# Patient Record
Sex: Male | Born: 1965 | Race: White | Hispanic: No | Marital: Single | Smoking: Never smoker
Health system: Southern US, Community
[De-identification: ages and names within clinical notes are randomized; demographics above are authoritative.]

## PROBLEM LIST (undated history)

## (undated) ENCOUNTER — Inpatient Hospital Stay: Admission: EM | Payer: Self-pay | Source: Home / Self Care

## (undated) DIAGNOSIS — C801 Malignant (primary) neoplasm, unspecified: Secondary | ICD-10-CM

## (undated) DIAGNOSIS — C6292 Malignant neoplasm of left testis, unspecified whether descended or undescended: Secondary | ICD-10-CM

## (undated) DIAGNOSIS — Z9289 Personal history of other medical treatment: Secondary | ICD-10-CM

## (undated) HISTORY — DX: Malignant neoplasm of left testis, unspecified whether descended or undescended: C62.92

## (undated) HISTORY — PX: WISDOM TOOTH EXTRACTION: SHX21

## (undated) HISTORY — PX: EYE SURGERY: SHX253

## (undated) HISTORY — PX: TESTICLE REMOVAL: SHX68

## (undated) HISTORY — PX: MANDIBLE FRACTURE SURGERY: SHX706

## (undated) HISTORY — PX: DUPUYTREN CONTRACTURE RELEASE: SHX1478

## (undated) HISTORY — PX: ORIF PELVIC FRACTURE: SUR948

---

## 2009-09-30 DIAGNOSIS — C801 Malignant (primary) neoplasm, unspecified: Secondary | ICD-10-CM

## 2009-09-30 HISTORY — DX: Malignant (primary) neoplasm, unspecified: C80.1

## 2009-12-02 ENCOUNTER — Ambulatory Visit: Payer: Self-pay | Admitting: Hematology & Oncology

## 2009-12-10 LAB — CBC WITH DIFFERENTIAL (CANCER CENTER ONLY)
Eosinophils Absolute: 0.1 10*3/uL (ref 0.0–0.5)
HGB: 15.9 g/dL (ref 13.0–17.1)
LYMPH%: 28.3 % (ref 14.0–48.0)
MCH: 31.6 pg (ref 28.0–33.4)
MCHC: 33.4 g/dL (ref 32.0–35.9)
MONO#: 0.4 10*3/uL (ref 0.1–0.9)
NEUT#: 3.2 10*3/uL (ref 1.5–6.5)
Platelets: 184 10*3/uL (ref 145–400)
RBC: 5.04 10*6/uL (ref 4.20–5.70)

## 2009-12-12 LAB — COMPREHENSIVE METABOLIC PANEL
Albumin: 4.5 g/dL (ref 3.5–5.2)
Alkaline Phosphatase: 65 U/L (ref 39–117)
BUN: 17 mg/dL (ref 6–23)
CO2: 24 mEq/L (ref 19–32)
Chloride: 106 mEq/L (ref 96–112)
Glucose, Bld: 96 mg/dL (ref 70–99)
Potassium: 4.8 mEq/L (ref 3.5–5.3)
Sodium: 140 mEq/L (ref 135–145)

## 2009-12-12 LAB — BETA HCG QUANT (REF LAB): Beta hCG, Tumor Marker: <0.5 m[IU]/mL (ref <5.0)

## 2009-12-12 LAB — LACTATE DEHYDROGENASE: LDH: 114 U/L (ref 94–250)

## 2010-01-06 ENCOUNTER — Ambulatory Visit: Payer: Self-pay | Admitting: Hematology & Oncology

## 2010-01-07 LAB — CBC WITH DIFFERENTIAL (CANCER CENTER ONLY)
BASO#: 0 10*3/uL (ref 0.0–0.2)
BASO%: 0.8 % (ref 0.0–2.0)
EOS%: 1.8 % (ref 0.0–7.0)
Eosinophils Absolute: 0.1 10*3/uL (ref 0.0–0.5)
HCT: 47 % (ref 38.7–49.9)
HGB: 15.5 g/dL (ref 13.0–17.1)
LYMPH#: 1.2 10*3/uL (ref 0.9–3.3)
LYMPH%: 38.1 % (ref 14.0–48.0)
MCV: 96 fL (ref 82–98)
MONO#: 0.3 10*3/uL (ref 0.1–0.9)
NEUT%: 50 % (ref 40.0–80.0)

## 2010-01-07 LAB — COMPREHENSIVE METABOLIC PANEL
AST: 17 U/L (ref 0–37)
Albumin: 4.8 g/dL (ref 3.5–5.2)
Calcium: 9.7 mg/dL (ref 8.4–10.5)
Chloride: 103 mEq/L (ref 96–112)
Potassium: 4.5 mEq/L (ref 3.5–5.3)
Sodium: 140 mEq/L (ref 135–145)
Total Bilirubin: 0.9 mg/dL (ref 0.3–1.2)
Total Protein: 7.6 g/dL (ref 6.0–8.3)

## 2010-02-16 ENCOUNTER — Ambulatory Visit: Payer: Self-pay | Admitting: Hematology & Oncology

## 2010-02-18 LAB — CBC WITH DIFFERENTIAL (CANCER CENTER ONLY)
BASO#: 0 10*3/uL (ref 0.0–0.2)
BASO%: 0.5 % (ref 0.0–2.0)
EOS%: 1.8 % (ref 0.0–7.0)
Eosinophils Absolute: 0.1 10*3/uL (ref 0.0–0.5)
HCT: 43.1 % (ref 38.7–49.9)
HGB: 14.5 g/dL (ref 13.0–17.1)
MONO%: 9.8 % (ref 0.0–13.0)
NEUT%: 41.1 % (ref 40.0–80.0)
Platelets: 149 10*3/uL (ref 145–400)
RBC: 4.3 10*6/uL (ref 4.20–5.70)

## 2010-02-20 LAB — COMPREHENSIVE METABOLIC PANEL
ALT: 17 U/L (ref 0–53)
AST: 14 U/L (ref 0–37)
Albumin: 4.1 g/dL (ref 3.5–5.2)
Alkaline Phosphatase: 52 U/L (ref 39–117)
Potassium: 4.3 mEq/L (ref 3.5–5.3)
Total Bilirubin: 0.6 mg/dL (ref 0.3–1.2)

## 2010-02-20 LAB — LACTATE DEHYDROGENASE: LDH: 107 U/L (ref 94–250)

## 2010-03-18 ENCOUNTER — Ambulatory Visit (HOSPITAL_BASED_OUTPATIENT_CLINIC_OR_DEPARTMENT_OTHER): Admission: RE | Admit: 2010-03-18 | Discharge: 2010-03-18 | Payer: Self-pay | Admitting: Hematology & Oncology

## 2010-03-18 ENCOUNTER — Ambulatory Visit: Payer: Self-pay | Admitting: Radiology

## 2010-04-01 ENCOUNTER — Ambulatory Visit: Payer: Self-pay | Admitting: Hematology & Oncology

## 2010-04-01 LAB — CBC WITH DIFFERENTIAL (CANCER CENTER ONLY)
EOS%: 2.3 % (ref 0.0–7.0)
HCT: 45.2 % (ref 38.7–49.9)
HGB: 15.3 g/dL (ref 13.0–17.1)
LYMPH#: 0.9 10*3/uL (ref 0.9–3.3)
MCH: 33.7 pg — ABNORMAL HIGH (ref 28.0–33.4)
NEUT#: 2.8 10*3/uL (ref 1.5–6.5)
NEUT%: 69 % (ref 40.0–80.0)
Platelets: 127 10*3/uL — ABNORMAL LOW (ref 145–400)
RBC: 4.54 10*6/uL (ref 4.20–5.70)
WBC: 4 10*3/uL (ref 4.0–10.0)

## 2010-04-03 LAB — COMPREHENSIVE METABOLIC PANEL
ALT: 16 U/L (ref 0–53)
Alkaline Phosphatase: 53 U/L (ref 39–117)
BUN: 20 mg/dL (ref 6–23)
CO2: 24 mEq/L (ref 19–32)
Chloride: 107 mEq/L (ref 96–112)
Glucose, Bld: 122 mg/dL — ABNORMAL HIGH (ref 70–99)
Potassium: 4.3 mEq/L (ref 3.5–5.3)
Total Bilirubin: 0.8 mg/dL (ref 0.3–1.2)

## 2010-04-03 LAB — AFP TUMOR MARKER: AFP-Tumor Marker: 6.4 ng/mL (ref 0.0–8.0)

## 2010-04-03 LAB — BETA HCG QUANT (REF LAB): Beta hCG, Tumor Marker: <0.5 m[IU]/mL (ref <5.0)

## 2010-07-13 ENCOUNTER — Ambulatory Visit: Payer: Self-pay | Admitting: Hematology & Oncology

## 2010-11-21 ENCOUNTER — Encounter: Payer: Self-pay | Admitting: Hematology & Oncology

## 2012-09-26 ENCOUNTER — Telehealth: Payer: Self-pay | Admitting: Hematology & Oncology

## 2012-09-26 NOTE — Telephone Encounter (Signed)
Pt called made 10-11-12 appointment MD aware

## 2012-10-11 ENCOUNTER — Other Ambulatory Visit (HOSPITAL_BASED_OUTPATIENT_CLINIC_OR_DEPARTMENT_OTHER): Payer: Self-pay | Admitting: Lab

## 2012-10-11 ENCOUNTER — Ambulatory Visit (HOSPITAL_BASED_OUTPATIENT_CLINIC_OR_DEPARTMENT_OTHER): Payer: Self-pay | Admitting: Hematology & Oncology

## 2012-10-11 VITALS — BP 125/83 | HR 69 | Temp 97.4°F | Resp 18 | Ht 77.0 in | Wt 235.0 lb

## 2012-10-11 DIAGNOSIS — E785 Hyperlipidemia, unspecified: Secondary | ICD-10-CM

## 2012-10-11 DIAGNOSIS — C6290 Malignant neoplasm of unspecified testis, unspecified whether descended or undescended: Secondary | ICD-10-CM

## 2012-10-11 DIAGNOSIS — E291 Testicular hypofunction: Secondary | ICD-10-CM

## 2012-10-11 LAB — CBC WITH DIFFERENTIAL (CANCER CENTER ONLY)
EOS%: 1.4 % (ref 0.0–7.0)
Eosinophils Absolute: 0.1 10*3/uL (ref 0.0–0.5)
HCT: 47.7 % (ref 38.7–49.9)
LYMPH#: 1.8 10*3/uL (ref 0.9–3.3)
MCHC: 33.3 g/dL (ref 32.0–35.9)
MONO#: 0.8 10*3/uL (ref 0.1–0.9)
NEUT#: 3.2 10*3/uL (ref 1.5–6.5)
Platelets: 150 10*3/uL (ref 145–400)
RBC: 5 10*6/uL (ref 4.20–5.70)
RDW: 13.1 % (ref 11.1–15.7)
WBC: 5.8 10*3/uL (ref 4.0–10.0)

## 2012-10-11 NOTE — Progress Notes (Signed)
This office note has been dictated.

## 2012-10-12 NOTE — Progress Notes (Signed)
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  CURRENT THERAPY:  Observation.  INTERIM HISTORY:  Mr. Jesse George comes in for a long awaited followup. Since we last saw him back in June of 2001, he has been incarcerated. It was some type of federal fraud.  He spent 2 years there.  He really looks incredibly good.  He obviously had a very good doctor while he was there.  He got scans, he got checkups.  He said that they were going to send [place] all of the data but I have not received anything.  He has had no physical complaints.  He has had no abdominal pain.  He has had no cough or shortness of breath.  He has had no leg swelling. He has had no problems going to the bathroom.  PHYSICAL EXAMINATION:  General:  This is a well-developed, well- nourished white gentleman in no obvious distress.  Vital signs:  97.4, pulse 69, respiratory rate 18, blood pressure 125/83.  Weight is 235. Head and neck:  Shows a normocephalic, atraumatic skull.  There are no ocular or oral lesions.  There are no palpable cervical or supraclavicular lymph nodes.  Lungs:  Clear bilaterally.  Cardiac: Regular rate and rhythm with a normal S1 and S2.  There are no murmurs, rubs or bruits.  Abdomen:  Soft with good bowel sounds.  There is no palpable abdominal mass.  There is no palpable hepatosplenomegaly.  He has a well-healed left inguinal orchiectomy scar.  Back:  No tenderness over the spine, ribs or hips.  Extremities:  Show no clubbing, cyanosis or edema.  LABORATORY STUDIES:  Show white cell count 5.8, hemoglobin 15.9, hematocrit 47.7, platelet count is 150.  IMPRESSION:  Mr. [name] is a 46 year old gentleman with a history of stage IB (T2 N0 M0) seminoma of the left testicle.  He underwent orchiectomy.  He got 2 cycles of adjuvant carboplatin.  This was completed in March of 2011.  I still feel that we need to follow him along every 6 months.  As such, I will set him up with a CT scan of the  chest, abdomen and pelvis in May.  We are following his tumor markers.  I am just glad to see that he is doing so well.    ______________________________ Josph Macho, M.D. PRE/MEDQ  D:  10/11/2012  T:  10/12/2012  Job:  [id number]

## 2012-10-14 LAB — COMPREHENSIVE METABOLIC PANEL
ALT: 25 U/L (ref 0–53)
AST: 17 U/L (ref 0–37)
Albumin: 4.3 g/dL (ref 3.5–5.2)
BUN: 19 mg/dL (ref 6–23)
CO2: 22 mEq/L (ref 19–32)
Chloride: 103 mEq/L (ref 96–112)
Potassium: 4.6 mEq/L (ref 3.5–5.3)
Sodium: 142 mEq/L (ref 135–145)
Total Bilirubin: 0.7 mg/dL (ref 0.3–1.2)

## 2012-10-14 LAB — LACTATE DEHYDROGENASE: LDH: 102 U/L (ref 94–250)

## 2012-10-18 ENCOUNTER — Other Ambulatory Visit (HOSPITAL_BASED_OUTPATIENT_CLINIC_OR_DEPARTMENT_OTHER): Payer: Self-pay | Admitting: Lab

## 2012-10-18 ENCOUNTER — Other Ambulatory Visit: Payer: Self-pay | Admitting: *Deleted

## 2012-10-18 DIAGNOSIS — C6290 Malignant neoplasm of unspecified testis, unspecified whether descended or undescended: Secondary | ICD-10-CM

## 2012-10-18 DIAGNOSIS — E291 Testicular hypofunction: Secondary | ICD-10-CM

## 2012-10-18 LAB — TESTOSTERONE: Testosterone: 537.53 ng/dL (ref 300–890)

## 2012-10-18 NOTE — Progress Notes (Signed)
Pt called to obtain the results of his testosterone level from his visit with Dr Myna Hidalgo on 10/11/12.  It was not drawn, therefore he will come in today to have it done.

## 2012-10-19 ENCOUNTER — Telehealth: Payer: Self-pay | Admitting: Oncology

## 2012-10-19 NOTE — Telephone Encounter (Addendum)
Message copied by Lacie Draft on Fri Oct 19, 2012  3:01 PM ------      Message from: Josph Macho      Created: Fri Oct 19, 2012  7:19 AM       Call - testosterone is great!!!  Pete  Left message on machine that lab work great. Teola Bradley, Elynn Patteson [employer]

## 2013-03-11 ENCOUNTER — Ambulatory Visit (HOSPITAL_BASED_OUTPATIENT_CLINIC_OR_DEPARTMENT_OTHER)
Admission: RE | Admit: 2013-03-11 | Discharge: 2013-03-11 | Disposition: A | Discharge status: Home / Self Care | Payer: BC Managed Care – PPO | Source: Ambulatory Visit | Attending: Hematology & Oncology | Admitting: Hematology & Oncology

## 2013-03-11 ENCOUNTER — Encounter (HOSPITAL_BASED_OUTPATIENT_CLINIC_OR_DEPARTMENT_OTHER): Payer: Self-pay

## 2013-03-11 ENCOUNTER — Other Ambulatory Visit: Payer: Self-pay | Admitting: Lab

## 2013-03-11 DIAGNOSIS — Z09 Encounter for follow-up examination after completed treatment for conditions other than malignant neoplasm: Secondary | ICD-10-CM | POA: Insufficient documentation

## 2013-03-11 DIAGNOSIS — C6290 Malignant neoplasm of unspecified testis, unspecified whether descended or undescended: Secondary | ICD-10-CM

## 2013-03-11 DIAGNOSIS — K7689 Other specified diseases of liver: Secondary | ICD-10-CM | POA: Insufficient documentation

## 2013-03-11 DIAGNOSIS — E785 Hyperlipidemia, unspecified: Secondary | ICD-10-CM

## 2013-03-11 DIAGNOSIS — Z8547 Personal history of malignant neoplasm of testis: Secondary | ICD-10-CM | POA: Insufficient documentation

## 2013-03-11 DIAGNOSIS — E291 Testicular hypofunction: Secondary | ICD-10-CM

## 2013-03-11 DIAGNOSIS — K8020 Calculus of gallbladder without cholecystitis without obstruction: Secondary | ICD-10-CM | POA: Insufficient documentation

## 2013-03-11 HISTORY — DX: Malignant (primary) neoplasm, unspecified: C80.1

## 2013-03-11 MED ORDER — IOHEXOL 300 MG/ML  SOLN
100.0000 mL | Freq: Once | INTRAMUSCULAR | Status: AC | PRN
  Administered 2013-03-11: 100 mL via INTRAVENOUS

## 2013-03-12 ENCOUNTER — Telehealth: Payer: Self-pay | Admitting: Hematology & Oncology

## 2013-03-12 NOTE — Telephone Encounter (Addendum)
Message copied by Cathi Roan on Tue Mar 12, 2013  3:31 PM ------      Message from: Josph Macho      Created: Tue Mar 12, 2013  7:25 AM       Please call and tell him that there is no evidence of recurrent testicular cancer on his CT scan. Thanks. Pete ------  5-13-   3:25   Called patient at home and  left message regarding above MD message, if any questions to call this office.  Lupita Raider LPN

## 2013-03-18 ENCOUNTER — Ambulatory Visit (HOSPITAL_BASED_OUTPATIENT_CLINIC_OR_DEPARTMENT_OTHER): Payer: BC Managed Care – PPO | Admitting: Hematology & Oncology

## 2013-03-18 VITALS — BP 122/83 | HR 91 | Temp 97.8°F | Resp 18 | Ht 75.0 in | Wt 237.0 lb

## 2013-03-18 DIAGNOSIS — C6292 Malignant neoplasm of left testis, unspecified whether descended or undescended: Secondary | ICD-10-CM

## 2013-03-18 DIAGNOSIS — Z8547 Personal history of malignant neoplasm of testis: Secondary | ICD-10-CM

## 2013-03-18 NOTE — Progress Notes (Signed)
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  CURRENT THERAPY:  Observation.  INTERIM HISTORY:  Jesse George comes in for followup.  We last saw him back in December.  He is doing well.  He is still being monitored by the federal authorities.  He got out of prison about I think 6 months ago or so.  He, hopefully, will be going on some vacations this summer.  When he travels, he has to let the authorities know where he is going.  He is feeling well.  He has had no abdominal pain.  He has had no leg pain.  He has had no cough or shortness of breath.  There has been no change in bowel or bladder habits.  He has had no headache.  We did go ahead and repeat his scans.  They were done back on May 12. The scans did not show any evidence of recurrent seminoma.  His last tumor markers were done back a year ago.  His alpha-fetoprotein and beta HCG were both normal.  PHYSICAL EXAMINATION:  General:  This is a well-developed, well- nourished white gentleman in no obvious distress.  Vital signs:  Show a temperature of 97.8, pulse 91, respiratory rate 18, blood pressure 1220/83.  Weight is 237.  Head and neck:  Shows a normocephalic, atraumatic skull.  There are no ocular or oral lesions.  There are no palpable cervical or supraclavicular lymph nodes.  Lungs:  Clear to percussion and auscultation bilaterally.  Cardiac:  Regular rate and rhythm with normal S1, S2.  There are no murmurs, rubs or bruits. Abdomen:  Soft with good bowel sounds.  There is no palpable abdominal mass.  There is no fluid wave.  There is no palpable hepatosplenomegaly. His left inguinal orchiectomy scar is well-healed.  Extremities:  Show no clubbing, cyanosis or edema.  Neurological:  Shows no focal neurological deficits.  Skin:  No rash, ecchymosis or petechia.  LABORATORY:  Not done this visit.  IMPRESSION:  Jesse George is a 47 year old white gentleman with a history of stage IB seminoma of the left  testicle.  We did go ahead and give him 2 cycles of adjuvant carboplatin.  He completed this in March of 2011.  We will continue to follow him along every 6 months or so.  We will go ahead and plan for followup scans when we see him back in the fall.  I do not see need for any additional lab work now.  We will get some lab work on him when we see him back in the fall.    ______________________________ Josph Macho, M.D. PRE/MEDQ  D:  03/18/2013  T:  03/18/2013  Job:  [id number]

## 2013-03-18 NOTE — Progress Notes (Signed)
This office note has been dictated.

## 2013-03-29 ENCOUNTER — Other Ambulatory Visit: Payer: Self-pay | Admitting: Hematology & Oncology

## 2013-03-29 DIAGNOSIS — N529 Male erectile dysfunction, unspecified: Secondary | ICD-10-CM

## 2013-03-29 MED ORDER — SILDENAFIL CITRATE 50 MG PO TABS: 50.0000 mg | ORAL_TABLET | Freq: Every day | ORAL | Status: DC | PRN

## 2013-08-26 ENCOUNTER — Other Ambulatory Visit: Payer: BC Managed Care – PPO | Admitting: Lab

## 2013-08-26 ENCOUNTER — Ambulatory Visit (HOSPITAL_BASED_OUTPATIENT_CLINIC_OR_DEPARTMENT_OTHER): Payer: BC Managed Care – PPO

## 2013-09-02 ENCOUNTER — Ambulatory Visit: Payer: BC Managed Care – PPO | Admitting: Hematology & Oncology

## 2013-11-04 ENCOUNTER — Ambulatory Visit (HOSPITAL_BASED_OUTPATIENT_CLINIC_OR_DEPARTMENT_OTHER)
Admission: RE | Admit: 2013-11-04 | Discharge: 2013-11-04 | Disposition: A | Discharge status: Home / Self Care | Payer: BC Managed Care – PPO | Source: Ambulatory Visit | Attending: Hematology & Oncology | Admitting: Hematology & Oncology

## 2013-11-04 ENCOUNTER — Encounter (HOSPITAL_BASED_OUTPATIENT_CLINIC_OR_DEPARTMENT_OTHER): Payer: Self-pay

## 2013-11-04 ENCOUNTER — Other Ambulatory Visit (HOSPITAL_BASED_OUTPATIENT_CLINIC_OR_DEPARTMENT_OTHER): Payer: BC Managed Care – PPO | Admitting: Lab

## 2013-11-04 DIAGNOSIS — K7689 Other specified diseases of liver: Secondary | ICD-10-CM | POA: Insufficient documentation

## 2013-11-04 DIAGNOSIS — Z8547 Personal history of malignant neoplasm of testis: Secondary | ICD-10-CM

## 2013-11-04 DIAGNOSIS — C6292 Malignant neoplasm of left testis, unspecified whether descended or undescended: Secondary | ICD-10-CM

## 2013-11-04 DIAGNOSIS — Z859 Personal history of malignant neoplasm, unspecified: Secondary | ICD-10-CM | POA: Insufficient documentation

## 2013-11-04 DIAGNOSIS — K8020 Calculus of gallbladder without cholecystitis without obstruction: Secondary | ICD-10-CM | POA: Insufficient documentation

## 2013-11-04 LAB — CMP (CANCER CENTER ONLY)
ALBUMIN: 3.7 g/dL (ref 3.3–5.5)
ALK PHOS: 51 U/L (ref 26–84)
ALT: 30 U/L (ref 10–47)
AST: 22 U/L (ref 11–38)
BILIRUBIN TOTAL: 1 mg/dL (ref 0.20–1.60)
BUN, Bld: 16 mg/dL (ref 7–22)
CO2: 33 mEq/L (ref 18–33)
Calcium: 8.9 mg/dL (ref 8.0–10.3)
Chloride: 104 mEq/L (ref 98–108)
Creat: 1 mg/dl (ref 0.6–1.2)
Glucose, Bld: 99 mg/dL (ref 73–118)
POTASSIUM: 4.4 meq/L (ref 3.3–4.7)
Sodium: 144 mEq/L (ref 128–145)
TOTAL PROTEIN: 7.4 g/dL (ref 6.4–8.1)

## 2013-11-04 LAB — CBC WITH DIFFERENTIAL (CANCER CENTER ONLY)
BASO#: 0 10*3/uL (ref 0.0–0.2)
BASO%: 0.4 % (ref 0.0–2.0)
EOS ABS: 0.1 10*3/uL (ref 0.0–0.5)
EOS%: 1.7 % (ref 0.0–7.0)
HEMATOCRIT: 50.3 % — AB (ref 38.7–49.9)
HEMOGLOBIN: 16.9 g/dL (ref 13.0–17.1)
LYMPH#: 1.7 10*3/uL (ref 0.9–3.3)
LYMPH%: 32.4 % (ref 14.0–48.0)
MCH: 32.1 pg (ref 28.0–33.4)
MCHC: 33.6 g/dL (ref 32.0–35.9)
MCV: 95 fL (ref 82–98)
MONO#: 0.5 10*3/uL (ref 0.1–0.9)
MONO%: 10.4 % (ref 0.0–13.0)
NEUT#: 2.9 10*3/uL (ref 1.5–6.5)
NEUT%: 55.1 % (ref 40.0–80.0)
Platelets: 142 10*3/uL — ABNORMAL LOW (ref 145–400)
RBC: 5.27 10*6/uL (ref 4.20–5.70)
RDW: 13.1 % (ref 11.1–15.7)
WBC: 5.2 10*3/uL (ref 4.0–10.0)

## 2013-11-04 MED ORDER — IOHEXOL 300 MG/ML  SOLN
100.0000 mL | Freq: Once | INTRAMUSCULAR | Status: AC | PRN
  Administered 2013-11-04: 100 mL via INTRAVENOUS

## 2013-11-05 ENCOUNTER — Telehealth: Payer: Self-pay | Admitting: *Deleted

## 2013-11-05 NOTE — Telephone Encounter (Signed)
Called patient to let him know that  no cancer has come back on his scans. Patient  Appreciated this news.

## 2013-11-05 NOTE — Telephone Encounter (Signed)
Message copied by Rico Ala on Tue Nov 05, 2013 11:28 AM ------      Message from: Burney Gauze R      Created: Mon Nov 04, 2013  6:22 PM       Please call and tell her that there is no cancer has come back on his scans. thanks. Pete E. ------

## 2013-11-07 LAB — BETA HCG QUANT (REF LAB): Beta hCG, Tumor Marker: <2 m[IU]/mL (ref <5.0)

## 2013-11-07 LAB — LACTATE DEHYDROGENASE: LDH: 120 U/L (ref 94–250)

## 2013-11-07 LAB — AFP TUMOR MARKER: AFP-Tumor Marker: 6 ng/mL (ref 0.0–8.0)

## 2013-11-11 ENCOUNTER — Ambulatory Visit (HOSPITAL_BASED_OUTPATIENT_CLINIC_OR_DEPARTMENT_OTHER): Payer: BC Managed Care – PPO | Admitting: Hematology & Oncology

## 2013-11-11 VITALS — BP 107/73 | HR 74 | Temp 98.1°F | Resp 18 | Ht 74.0 in | Wt 224.0 lb

## 2013-11-11 DIAGNOSIS — Z8547 Personal history of malignant neoplasm of testis: Secondary | ICD-10-CM

## 2013-11-11 DIAGNOSIS — C6292 Malignant neoplasm of left testis, unspecified whether descended or undescended: Secondary | ICD-10-CM

## 2013-11-11 NOTE — Progress Notes (Signed)
This office note has been dictated.

## 2013-11-12 NOTE — Progress Notes (Signed)
DIAGNOSIS:  Stage IB (T2, N0, M0) seminoma of the left testicle.  CURRENT THERAPY:  Observation.  INTERIM HISTORY:  Jesse George comes in for followup.  He was every 6 months or so.  He is doing quite well.  He still is in a relationship that he is happy about.  He is not having as much problem with the __________.  He was incarcerated for I think couple years.  He has been out of prison now probably for about a year now.  We are following his testicular cancer with CT scans.  He had a set of scans done back on January 5th of this year.  The CT scans did not show any evidence of recurrent disease.  His tumor markers also were done on January 5th.  His alpha fetoprotein was 6.  Beta hCG was less than 2.  LDH was 120.  He has had no problems with cough or shortness of breath.  He has had no nausea or vomiting.  There has been no leg swelling.  He has had no rashes.  He has had no change in bowel or bladder habits.  He did have a sebaceous cyst removed from his back by dermatologist I think about a month or so ago.  He has noticed an inflammatory lesion on the right side of the scrotal sac.  This may need to be opened up surgically by his urologist.  PHYSICAL EXAMINATION:  General:  This is a well-developed, well- nourished white gentleman, in no obvious distress.  Vital Signs: Temperature of 98.1, pulse 74, respiratory rate 18, blood pressure 107/73, weight is 224 pounds.  Head and Neck:  Normocephalic, atraumatic skull.  There are no ocular or oral lesions.  He has no palpable cervical or supraclavicular lymph nodes.  Lungs:  Clear bilaterally. Cardiac:  Regular rate and rhythm with a normal S1, S2.  There are no murmurs, rubs, or bruits.  Abdomen:  Soft.  He has good bowel sounds. There is no fluid wave.  There is no palpable abdominal mass.  There is no palpable hepatosplenomegaly.  He has well-healed left inguinal orchiectomy scar.  Scrotal exam does show a forming carbuncle in  the inferior lateral aspect of the right scrotal sac.  This is tender to touch.  This is quite firm.  Extremity shows no clubbing, cyanosis, or edema.  He has good range motion of his joints.  Neurological:  No focal neurological deficits.  Skin:  No rashes, ecchymosis, or petechia. Again, he has the carbuncle on the right scrotal sac.  LABORATORY DATA:  His laboratory says white cell count is 5.2, hemoglobin 17, hematocrit 50, platelet count 142.  Electrolytes are normal in value.  IMPRESSION:  Jesse George is a 48 year old gentleman with history of stage IB seminoma of the left testicle.  He underwent orchidectomy.  We did give him 2 cycles of adjuvant carboplatin.  He completed this in March 2011.  For now, I do not see any evidence of recurrent disease.  I told him to put some moist warm compresses on this carbuncle.  This continues to expand, and does not drain itself, then he likely will need to have a surgical incision for this.  He does understand this.  He does have an urologist that he can see.  We will go ahead and plan to get him back to see [place] in another 6 months.  Once we get him through 5 years, then we will go to yearly evaluation without having to  do scans.    ______________________________ Volanda Napoleon, M.D. PRE/MEDQ  D:  11/11/2013  T:  11/12/2013  Job:  [id number]

## 2013-11-15 ENCOUNTER — Telehealth: Payer: Self-pay | Admitting: Hematology & Oncology

## 2013-11-15 ENCOUNTER — Telehealth: Payer: Self-pay | Admitting: Nurse Practitioner

## 2013-11-15 NOTE — Telephone Encounter (Addendum)
Message copied by Jimmy Footman on Fri Nov 15, 2013  4:15 PM ------      Message from: Burney Gauze R      Created: Thu Nov 07, 2013  6:35 PM       Call - labs are ok!!  Peet  Pt verbalized understanding and appreciation. ------

## 2013-11-15 NOTE — Telephone Encounter (Signed)
Pt in ofc today to drop off [place] Application. Sending application via inter ofc mail today to [place]

## 2013-12-13 ENCOUNTER — Telehealth: Payer: Self-pay | Admitting: Hematology & Oncology

## 2013-12-13 NOTE — Telephone Encounter (Signed)
Pt's mother called wanting to speak to the office manager. She would not say why and that no one would help her. I transferred her to managers voice mail

## 2014-03-07 ENCOUNTER — Encounter: Payer: Self-pay | Admitting: Nurse Practitioner

## 2014-03-07 NOTE — Progress Notes (Signed)
Certificate of disability for property tax exclusion was faxed to [place] at [phone]. Confirmation received.

## 2014-05-05 ENCOUNTER — Other Ambulatory Visit (HOSPITAL_BASED_OUTPATIENT_CLINIC_OR_DEPARTMENT_OTHER): Payer: BC Managed Care – PPO | Admitting: Lab

## 2014-05-05 ENCOUNTER — Ambulatory Visit (HOSPITAL_BASED_OUTPATIENT_CLINIC_OR_DEPARTMENT_OTHER)
Admission: RE | Admit: 2014-05-05 | Discharge: 2014-05-05 | Disposition: A | Discharge status: Home / Self Care | Payer: BC Managed Care – PPO | Source: Ambulatory Visit | Attending: Hematology & Oncology | Admitting: Hematology & Oncology

## 2014-05-05 ENCOUNTER — Encounter (HOSPITAL_BASED_OUTPATIENT_CLINIC_OR_DEPARTMENT_OTHER): Payer: Self-pay

## 2014-05-05 DIAGNOSIS — Z8547 Personal history of malignant neoplasm of testis: Secondary | ICD-10-CM | POA: Insufficient documentation

## 2014-05-05 DIAGNOSIS — C6292 Malignant neoplasm of left testis, unspecified whether descended or undescended: Secondary | ICD-10-CM

## 2014-05-05 DIAGNOSIS — Z9079 Acquired absence of other genital organ(s): Secondary | ICD-10-CM | POA: Insufficient documentation

## 2014-05-05 DIAGNOSIS — C6290 Malignant neoplasm of unspecified testis, unspecified whether descended or undescended: Secondary | ICD-10-CM

## 2014-05-05 LAB — CBC WITH DIFFERENTIAL (CANCER CENTER ONLY)
BASO#: 0 10*3/uL (ref 0.0–0.2)
BASO%: 0.2 % (ref 0.0–2.0)
EOS ABS: 0.1 10*3/uL (ref 0.0–0.5)
EOS%: 1.5 % (ref 0.0–7.0)
HCT: 47.7 % (ref 38.7–49.9)
HGB: 16.3 g/dL (ref 13.0–17.1)
LYMPH#: 1.4 10*3/uL (ref 0.9–3.3)
LYMPH%: 26.6 % (ref 14.0–48.0)
MCH: 33.3 pg (ref 28.0–33.4)
MCHC: 34.2 g/dL (ref 32.0–35.9)
MCV: 98 fL (ref 82–98)
MONO#: 0.5 10*3/uL (ref 0.1–0.9)
MONO%: 9.2 % (ref 0.0–13.0)
NEUT%: 62.5 % (ref 40.0–80.0)
NEUTROS ABS: 3.2 10*3/uL (ref 1.5–6.5)
Platelets: 136 10*3/uL — ABNORMAL LOW (ref 145–400)
RBC: 4.89 10*6/uL (ref 4.20–5.70)
RDW: 12.9 % (ref 11.1–15.7)
WBC: 5.2 10*3/uL (ref 4.0–10.0)

## 2014-05-05 LAB — CMP (CANCER CENTER ONLY)
ALK PHOS: 43 U/L (ref 26–84)
ALT(SGPT): 33 U/L (ref 10–47)
AST: 24 U/L (ref 11–38)
Albumin: 3.7 g/dL (ref 3.3–5.5)
BUN: 18 mg/dL (ref 7–22)
CO2: 29 mEq/L (ref 18–33)
CREATININE: 1 mg/dL (ref 0.6–1.2)
Calcium: 8.3 mg/dL (ref 8.0–10.3)
Chloride: 98 mEq/L (ref 98–108)
Glucose, Bld: 123 mg/dL — ABNORMAL HIGH (ref 73–118)
POTASSIUM: 4.6 meq/L (ref 3.3–4.7)
Sodium: 142 mEq/L (ref 128–145)
Total Bilirubin: 0.7 mg/dl (ref 0.20–1.60)
Total Protein: 7.1 g/dL (ref 6.4–8.1)

## 2014-05-05 MED ORDER — IOHEXOL 300 MG/ML  SOLN
100.0000 mL | Freq: Once | INTRAMUSCULAR | Status: AC | PRN
  Administered 2014-05-05: 100 mL via INTRAVENOUS

## 2014-05-08 LAB — BETA HCG QUANT (REF LAB): Beta hCG, Tumor Marker: <2 m[IU]/mL (ref <5.0)

## 2014-05-08 LAB — AFP TUMOR MARKER: AFP TUMOR MARKER: 5.2 ng/mL (ref 0.0–8.0)

## 2014-05-08 LAB — LACTATE DEHYDROGENASE: LDH: 137 U/L (ref 94–250)

## 2014-05-09 ENCOUNTER — Telehealth: Payer: Self-pay | Admitting: *Deleted

## 2014-05-09 NOTE — Telephone Encounter (Addendum)
Message copied by Lenn Sink on Fri May 09, 2014  9:32 AM ------      Message from: Volanda Napoleon      Created: Tue May 06, 2014  1:53 PM       Call -ct scan is perfect!!  No cancer!! ------Informed pt that CT scan shows no cancer.

## 2014-05-12 ENCOUNTER — Ambulatory Visit: Payer: BC Managed Care – PPO | Admitting: Hematology & Oncology

## 2014-05-16 ENCOUNTER — Ambulatory Visit (HOSPITAL_BASED_OUTPATIENT_CLINIC_OR_DEPARTMENT_OTHER): Payer: BC Managed Care – PPO | Admitting: Hematology & Oncology

## 2014-05-16 ENCOUNTER — Encounter: Payer: Self-pay | Admitting: Hematology & Oncology

## 2014-05-16 VITALS — BP 121/78 | HR 86 | Temp 98.0°F | Resp 18 | Ht 72.0 in | Wt 236.0 lb

## 2014-05-16 DIAGNOSIS — Z8547 Personal history of malignant neoplasm of testis: Secondary | ICD-10-CM

## 2014-05-16 DIAGNOSIS — C6212 Malignant neoplasm of descended left testis: Secondary | ICD-10-CM

## 2014-05-16 DIAGNOSIS — C6292 Malignant neoplasm of left testis, unspecified whether descended or undescended: Secondary | ICD-10-CM | POA: Insufficient documentation

## 2014-05-16 HISTORY — DX: Malignant neoplasm of left testis, unspecified whether descended or undescended: C62.92

## 2014-05-18 NOTE — Progress Notes (Signed)
Hematology and Oncology Follow Up Visit  Jesse George [id number] Apr 21, 1966 48 y.o. 05/18/2014   Principle Diagnosis:  Stage IB (T2, N0, M0) seminoma of the left testicle.  Current Therapy:    Observation     Interim History:  [name] is back for followup. We saw him back in January.  He will have followup scans done. There were done last week. The scans did not show any evidence of recurrent disease.  He is doing well. He's had no complaints. He's had no cough or shortness of breath. He's had no change in bowel or bladder habits. He's had no rashes. He's had no headaches. He's had no weight loss or weight gain. There's been no change in medications.  His tumor markers have all been normal so far.  Medications: Current outpatient prescriptions:sildenafil (VIAGRA) 50 MG tablet, Take 1 tablet (50 mg total) by mouth daily as needed for erectile dysfunction., Disp: 15 tablet, Rfl: 0  Allergies:  Allergies  Allergen Reactions  . Penicillins   . Tetanus Toxoids     Past Medical History, Surgical history, Social history, and Family History were reviewed and updated.  Review of Systems: As above  Physical Exam:  height is 6' (1.829 m) and weight is 236 lb (107.049 kg). His oral temperature is 98 F (36.7 C). His blood pressure is 121/78 and his pulse is 86. His respiration is 18.   Well-developed and well-nourished white woman in no obvious distress. Head and neck exam shows no ocular or oral lesion. There are no palpable cervical or supraclavicular lymph nodes. Lungs are clear. Cardiac exam regular in rhythm with no murmurs rubs or bruits. Abdomen is soft. Has good bowel sounds. Has well-healed left orchiectomy scar. There is no palpable inversely give. Neck exam no tenderness over the spine ribs or hips. Extremities shows no clubbing cyanosis or edema. Neurological exam shows no focal neurological deficits.  Lab Results  Component Value Date   WBC 5.2 05/05/2014   HGB 16.3  05/05/2014   HCT 47.7 05/05/2014   MCV 98 05/05/2014   PLT 136* 05/05/2014     Chemistry      Component Value Date/Time   NA 142 05/05/2014 0848   NA 142 10/11/2012 1125   K 4.6 05/05/2014 0848   K 4.6 10/11/2012 1125   CL 98 05/05/2014 0848   CL 103 10/11/2012 1125   CO2 29 05/05/2014 0848   CO2 22 10/11/2012 1125   BUN 18 05/05/2014 0848   BUN 19 10/11/2012 1125   CREATININE 1.0 05/05/2014 0848   CREATININE 0.87 10/11/2012 1125      Component Value Date/Time   CALCIUM 8.3 05/05/2014 0848   CALCIUM 9.3 10/11/2012 1125   ALKPHOS 43 05/05/2014 0848   ALKPHOS 53 10/11/2012 1125   AST 24 05/05/2014 0848   AST 17 10/11/2012 1125   ALT 33 05/05/2014 0848   ALT 25 10/11/2012 1125   BILITOT 0.70 05/05/2014 0848   BILITOT 0.7 10/11/2012 1125      Alpha fetoprotein is 5.2. beta-HCG is less than 2. LDH is 137.   Impression and Plan: Jesse George is a 48 year old gentleman with a stage I seminoma of the left testicle. We did go ahead and give him 2 cycles of adjuvant carboplatin. He completed this back in March of 2011.  I don't see any evidence of recurrent disease. I really think that he is cured. However, we will continue to follow him along per recommendations.  I will set  up a CT scan of the abdomen and pelvis in 6 months. I do a chest x-ray also. We will do this today that we see him.  If these scans and x-ray looked okay, then I don't see where to do any further scans after that.  On the ladder he is doing well. Everything really looks to be doing fine.  I'll see him back in 6 months.   Volanda Napoleon, MD 7/19/20159:03 AM

## 2014-10-20 ENCOUNTER — Other Ambulatory Visit: Payer: BC Managed Care – PPO | Admitting: Lab

## 2014-10-20 ENCOUNTER — Ambulatory Visit (HOSPITAL_BASED_OUTPATIENT_CLINIC_OR_DEPARTMENT_OTHER): Payer: BC Managed Care – PPO

## 2014-10-21 ENCOUNTER — Telehealth: Payer: Self-pay | Admitting: *Deleted

## 2014-10-21 ENCOUNTER — Ambulatory Visit (HOSPITAL_BASED_OUTPATIENT_CLINIC_OR_DEPARTMENT_OTHER)
Admission: RE | Admit: 2014-10-21 | Discharge: 2014-10-21 | Disposition: A | Discharge status: Home / Self Care | Payer: BC Managed Care – PPO | Source: Ambulatory Visit | Attending: Hematology & Oncology | Admitting: Hematology & Oncology

## 2014-10-21 ENCOUNTER — Ambulatory Visit (HOSPITAL_BASED_OUTPATIENT_CLINIC_OR_DEPARTMENT_OTHER): Payer: BC Managed Care – PPO | Admitting: Lab

## 2014-10-21 ENCOUNTER — Encounter (HOSPITAL_BASED_OUTPATIENT_CLINIC_OR_DEPARTMENT_OTHER): Payer: Self-pay

## 2014-10-21 DIAGNOSIS — C6212 Malignant neoplasm of descended left testis: Secondary | ICD-10-CM

## 2014-10-21 DIAGNOSIS — C6292 Malignant neoplasm of left testis, unspecified whether descended or undescended: Secondary | ICD-10-CM | POA: Insufficient documentation

## 2014-10-21 DIAGNOSIS — K760 Fatty (change of) liver, not elsewhere classified: Secondary | ICD-10-CM | POA: Insufficient documentation

## 2014-10-21 DIAGNOSIS — K8080 Other cholelithiasis without obstruction: Secondary | ICD-10-CM | POA: Insufficient documentation

## 2014-10-21 LAB — CBC WITH DIFFERENTIAL (CANCER CENTER ONLY)
BASO#: 0 10*3/uL (ref 0.0–0.2)
BASO%: 0.5 % (ref 0.0–2.0)
EOS ABS: 0.1 10*3/uL (ref 0.0–0.5)
EOS%: 2.1 % (ref 0.0–7.0)
HCT: 46.8 % (ref 38.7–49.9)
HGB: 16 g/dL (ref 13.0–17.1)
LYMPH#: 1.3 10*3/uL (ref 0.9–3.3)
LYMPH%: 29.5 % (ref 14.0–48.0)
MCH: 33.2 pg (ref 28.0–33.4)
MCHC: 34.2 g/dL (ref 32.0–35.9)
MCV: 97 fL (ref 82–98)
MONO#: 0.4 10*3/uL (ref 0.1–0.9)
MONO%: 10 % (ref 0.0–13.0)
NEUT%: 57.9 % (ref 40.0–80.0)
NEUTROS ABS: 2.5 10*3/uL (ref 1.5–6.5)
PLATELETS: 135 10*3/uL — AB (ref 145–400)
RBC: 4.82 10*6/uL (ref 4.20–5.70)
RDW: 12.5 % (ref 11.1–15.7)
WBC: 4.3 10*3/uL (ref 4.0–10.0)

## 2014-10-21 LAB — CMP (CANCER CENTER ONLY)
ALBUMIN: 3.5 g/dL (ref 3.3–5.5)
ALT: 44 U/L (ref 10–47)
AST: 32 U/L (ref 11–38)
Alkaline Phosphatase: 41 U/L (ref 26–84)
BUN, Bld: 15 mg/dL (ref 7–22)
CHLORIDE: 102 meq/L (ref 98–108)
CO2: 28 meq/L (ref 18–33)
Calcium: 8.9 mg/dL (ref 8.0–10.3)
Creat: 0.5 mg/dl — ABNORMAL LOW (ref 0.6–1.2)
GLUCOSE: 98 mg/dL (ref 73–118)
POTASSIUM: 4.3 meq/L (ref 3.3–4.7)
SODIUM: 140 meq/L (ref 128–145)
TOTAL PROTEIN: 6.9 g/dL (ref 6.4–8.1)
Total Bilirubin: 1 mg/dl (ref 0.20–1.60)

## 2014-10-21 MED ORDER — IOHEXOL 300 MG/ML  SOLN
100.0000 mL | Freq: Once | INTRAMUSCULAR | Status: AC | PRN
  Administered 2014-10-21: 100 mL via INTRAVENOUS

## 2014-10-21 NOTE — Telephone Encounter (Addendum)
Patient happy with results.   ----- Message from Volanda Napoleon, MD sent at 10/21/2014 11:54 AM EST ----- Please call and let him know that there is no cancer on his x-rays.

## 2014-10-23 ENCOUNTER — Ambulatory Visit (HOSPITAL_BASED_OUTPATIENT_CLINIC_OR_DEPARTMENT_OTHER): Payer: BC Managed Care – PPO | Admitting: Hematology & Oncology

## 2014-10-23 ENCOUNTER — Encounter: Payer: Self-pay | Admitting: Hematology & Oncology

## 2014-10-23 VITALS — BP 123/85 | HR 85 | Temp 97.7°F | Resp 18 | Ht 72.0 in | Wt 240.0 lb

## 2014-10-23 DIAGNOSIS — C6292 Malignant neoplasm of left testis, unspecified whether descended or undescended: Secondary | ICD-10-CM

## 2014-10-23 NOTE — Progress Notes (Signed)
Hematology and Oncology Follow Up Visit  Johnathin Vanderschaaf [id number] 1966-06-09 48 y.o. 10/23/2014   Principle Diagnosis:  Stage IB (T2, N0, M0) seminoma of the left testicle.  Current Therapy:    Observation     Interim History:  Mr.  Ehrler is back for followup. We saw him back in July. He is trying to help with his mother. She fell. She's having a hard time. She is still at home. She does not want to go anywhere. He does go over to her house twice a day.  We did go ahead and get some scans on him. The CTs was scans were done yesterday. They did not show any evidence of recurrent disease. He had a chest x-ray which did not show any evidence of pulmonary recurrence.  His tumor markers were normal. Doppler fetoprotein was 4.6. His LDH was 111. His beta- hCG was a < 2.  He still has a girlfriend. He really is enjoying the time with her. He is trying to exercise. He is working part-time.   Marland Kitchen He's had no cough or shortness of breath. He's had no change in bowel or bladder habits. He's had no rashes. He's had no headaches. He's had no weight loss or weight gain. There's been no change in medications.    Medications: Current outpatient prescriptions: sildenafil (VIAGRA) 50 MG tablet, Take 1 tablet (50 mg total) by mouth daily as needed for erectile dysfunction., Disp: 15 tablet, Rfl: 0  Allergies:  Allergies  Allergen Reactions  . Penicillins   . Tetanus Toxoids     Past Medical History, Surgical history, Social history, and Family History were reviewed and updated.  Review of Systems: As above  Physical Exam:  height is 6' (1.829 m) and weight is 240 lb (108.863 kg). His oral temperature is 97.7 F (36.5 C). His blood pressure is 123/85 and his pulse is 85. His respiration is 18.   Well-developed and well-nourished white woman in no obvious distress. Head and neck exam shows no ocular or oral lesion. There are no palpable cervical or supraclavicular lymph nodes. Lungs are clear.  Cardiac exam regular rate and rhythm with no murmurs rubs or bruits. Abdomen is soft. Has good bowel sounds. He has a well-healed left orchiectomy scar. There is no palpable inguinal lymph nodes.. Back exam no tenderness over the spine ribs or hips. Extremities shows no clubbing cyanosis or edema. Neurological exam shows no focal neurological deficits.  Lab Results  Component Value Date   WBC 4.3 10/21/2014   HGB 16.0 10/21/2014   HCT 46.8 10/21/2014   MCV 97 10/21/2014   PLT 135* 10/21/2014     Chemistry      Component Value Date/Time   NA 140 10/21/2014 0852   NA 142 10/11/2012 1125   K 4.3 10/21/2014 0852   K 4.6 10/11/2012 1125   CL 102 10/21/2014 0852   CL 103 10/11/2012 1125   CO2 28 10/21/2014 0852   CO2 22 10/11/2012 1125   BUN 15 10/21/2014 0852   BUN 19 10/11/2012 1125   CREATININE 0.5* 10/21/2014 0852   CREATININE 0.87 10/11/2012 1125      Component Value Date/Time   CALCIUM 8.9 10/21/2014 0852   CALCIUM 9.3 10/11/2012 1125   ALKPHOS 41 10/21/2014 0852   ALKPHOS 53 10/11/2012 1125   AST 32 10/21/2014 0852   AST 17 10/11/2012 1125   ALT 44 10/21/2014 0852   ALT 25 10/11/2012 1125   BILITOT 1.00 10/21/2014 7169  BILITOT 0.7 10/11/2012 1125      Alpha fetoprotein is 5.2. beta-HCG is less than 2. LDH is 137.   Impression and Plan: Mr. Aiello is a 48 year old gentleman with a stage I seminoma of the left testicle. He had an orchiectomy back in January 2011. We did go ahead and give him 2 cycles of adjuvant carboplatin. He completed this back in March of 2011.  I don't see any evidence of recurrent disease. I really think that he is cured. However, we will continue to follow him along per recommendations.  I do not think that we need any more scans on him. We can just follow his labs and tumor markers.   I'll see him back in 6 months.   Volanda Napoleon, MD 12/24/201511:43 AM

## 2014-10-27 ENCOUNTER — Telehealth: Payer: Self-pay | Admitting: Hematology & Oncology

## 2014-10-27 LAB — BETA HCG QUANT (REF LAB): Beta hCG, Tumor Marker: <2 m[IU]/mL (ref <5.0)

## 2014-10-27 LAB — AFP TUMOR MARKER: AFP-Tumor Marker: 3.3 ng/mL (ref <6.1)

## 2014-10-27 LAB — LACTATE DEHYDROGENASE: LDH: 117 U/L (ref 94–250)

## 2014-10-27 LAB — AFP TUMOR MARKER-PREVIOUS METHOD: AFP TUMOR MARKER: 4.6 ng/mL (ref 0.0–8.0)

## 2014-10-27 NOTE — Telephone Encounter (Signed)
Mailed June schedule

## 2014-11-03 ENCOUNTER — Other Ambulatory Visit (HOSPITAL_BASED_OUTPATIENT_CLINIC_OR_DEPARTMENT_OTHER): Payer: BC Managed Care – PPO

## 2014-11-03 ENCOUNTER — Ambulatory Visit: Payer: BC Managed Care – PPO | Admitting: Hematology & Oncology

## 2014-11-03 ENCOUNTER — Other Ambulatory Visit: Payer: BC Managed Care – PPO | Admitting: Lab

## 2014-11-10 ENCOUNTER — Ambulatory Visit: Payer: BC Managed Care – PPO | Admitting: Hematology & Oncology

## 2015-01-15 DIAGNOSIS — M542 Cervicalgia: Secondary | ICD-10-CM | POA: Insufficient documentation

## 2015-01-15 DIAGNOSIS — M545 Low back pain, unspecified: Secondary | ICD-10-CM | POA: Insufficient documentation

## 2015-01-16 ENCOUNTER — Telehealth: Payer: Self-pay | Admitting: Hematology & Oncology

## 2015-01-16 NOTE — Telephone Encounter (Signed)
Faxed medical records to:  [facility] [address] [place] [address]  P: [id number] F: [phone]   ALL RECORDS   COPY SCANNED

## 2015-02-06 ENCOUNTER — Encounter: Payer: Self-pay | Admitting: Hematology & Oncology

## 2015-04-27 ENCOUNTER — Encounter: Payer: Self-pay | Admitting: Oncology

## 2015-04-27 ENCOUNTER — Other Ambulatory Visit (HOSPITAL_BASED_OUTPATIENT_CLINIC_OR_DEPARTMENT_OTHER): Payer: 59

## 2015-04-27 ENCOUNTER — Ambulatory Visit (HOSPITAL_BASED_OUTPATIENT_CLINIC_OR_DEPARTMENT_OTHER): Payer: 59 | Admitting: Hematology & Oncology

## 2015-04-27 VITALS — BP 119/80 | HR 81 | Temp 98.1°F | Resp 20 | Wt 247.0 lb

## 2015-04-27 DIAGNOSIS — Z8547 Personal history of malignant neoplasm of testis: Secondary | ICD-10-CM | POA: Diagnosis not present

## 2015-04-27 DIAGNOSIS — C6292 Malignant neoplasm of left testis, unspecified whether descended or undescended: Secondary | ICD-10-CM

## 2015-04-27 LAB — CBC WITH DIFFERENTIAL (CANCER CENTER ONLY)
BASO#: 0 10*3/uL (ref 0.0–0.2)
BASO%: 0.2 % (ref 0.0–2.0)
EOS ABS: 0.1 10*3/uL (ref 0.0–0.5)
EOS%: 1.6 % (ref 0.0–7.0)
HCT: 46.3 % (ref 38.7–49.9)
HEMOGLOBIN: 15.9 g/dL (ref 13.0–17.1)
LYMPH#: 1.4 10*3/uL (ref 0.9–3.3)
LYMPH%: 31.8 % (ref 14.0–48.0)
MCH: 33.5 pg — ABNORMAL HIGH (ref 28.0–33.4)
MCHC: 34.3 g/dL (ref 32.0–35.9)
MCV: 98 fL (ref 82–98)
MONO#: 0.3 10*3/uL (ref 0.1–0.9)
MONO%: 8 % (ref 0.0–13.0)
NEUT#: 2.5 10*3/uL (ref 1.5–6.5)
NEUT%: 58.4 % (ref 40.0–80.0)
PLATELETS: 133 10*3/uL — AB (ref 145–400)
RBC: 4.75 10*6/uL (ref 4.20–5.70)
RDW: 12.7 % (ref 11.1–15.7)
WBC: 4.3 10*3/uL (ref 4.0–10.0)

## 2015-04-27 LAB — CMP (CANCER CENTER ONLY)
ALBUMIN: 3.6 g/dL (ref 3.3–5.5)
ALT(SGPT): 52 U/L — ABNORMAL HIGH (ref 10–47)
AST: 34 U/L (ref 11–38)
Alkaline Phosphatase: 53 U/L (ref 26–84)
BUN, Bld: 11 mg/dL (ref 7–22)
CHLORIDE: 100 meq/L (ref 98–108)
CO2: 27 mEq/L (ref 18–33)
Calcium: 8.5 mg/dL (ref 8.0–10.3)
Creat: 1 mg/dl (ref 0.6–1.2)
Glucose, Bld: 159 mg/dL — ABNORMAL HIGH (ref 73–118)
POTASSIUM: 4 meq/L (ref 3.3–4.7)
Sodium: 139 mEq/L (ref 128–145)
TOTAL PROTEIN: 6.6 g/dL (ref 6.4–8.1)
Total Bilirubin: 0.9 mg/dl (ref 0.20–1.60)

## 2015-04-27 NOTE — Progress Notes (Signed)
Hematology and Oncology Follow Up Visit  Brantly Kalman [id number] 07-May-1966 49 y.o. 04/27/2015   Principle Diagnosis:  Stage IB (T2, N0, M0) seminoma of the left testicle.  Current Therapy:    Observation     Interim History:  Mr.  Winchell is back for followup. He is doing quite well. He and his girlfriend are still together. They certainly seem to be enjoying each other. They may go up to [place] to visit some family later on this summer.  He's had no problems with bowels or bladder. He's had no abdominal pain. He's had no cough. There's been no shortness of breath. He's had no leg swelling.  His last tumor markers all were within normal limits. His last alpha-fetoprotein was 4.5. His beta-hCG was less than 2.0. His LDH was 117.  He's had no rashes.  Overall, his performance status is ECOG 0. had no headaches. He's had no weight loss or weight gain. There's been no change in medications.    Medications:  Current outpatient prescriptions:  .  sildenafil (VIAGRA) 50 MG tablet, Take 1 tablet (50 mg total) by mouth daily as needed for erectile dysfunction., Disp: 15 tablet, Rfl: 0  Allergies:  Allergies  Allergen Reactions  . Penicillins   . Tetanus Toxoids     Past Medical History, Surgical history, Social history, and Family History were reviewed and updated.  Review of Systems: As above  Physical Exam:  weight is 247 lb (112.038 kg). His oral temperature is 98.1 F (36.7 C). His blood pressure is 119/80 and his pulse is 81. His respiration is 20.   Well-developed and well-nourished white woman in no obvious distress. Head and neck exam shows no ocular or oral lesion. There are no palpable cervical or supraclavicular lymph nodes. Lungs are clear. Cardiac exam regular rate and rhythm with no murmurs rubs or bruits. Abdomen is soft. Has good bowel sounds. He has a well-healed left orchiectomy scar. There is no palpable inguinal lymph nodes.. Back exam no tenderness over the  spine ribs or hips. Extremities shows no clubbing cyanosis or edema. Neurological exam shows no focal neurological deficits.  Lab Results  Component Value Date   WBC 4.3 04/27/2015   HGB 15.9 04/27/2015   HCT 46.3 04/27/2015   MCV 98 04/27/2015   PLT 133* 04/27/2015     Chemistry      Component Value Date/Time   NA 139 04/27/2015 0914   NA 142 10/11/2012 1125   K 4.0 04/27/2015 0914   K 4.6 10/11/2012 1125   CL 100 04/27/2015 0914   CL 103 10/11/2012 1125   CO2 27 04/27/2015 0914   CO2 22 10/11/2012 1125   BUN 11 04/27/2015 0914   BUN 19 10/11/2012 1125   CREATININE 1.0 04/27/2015 0914   CREATININE 0.87 10/11/2012 1125      Component Value Date/Time   CALCIUM 8.5 04/27/2015 0914   CALCIUM 9.3 10/11/2012 1125   ALKPHOS 53 04/27/2015 0914   ALKPHOS 53 10/11/2012 1125   AST 34 04/27/2015 0914   AST 17 10/11/2012 1125   ALT 52* 04/27/2015 0914   ALT 25 10/11/2012 1125   BILITOT 0.90 04/27/2015 0914   BILITOT 0.7 10/11/2012 1125      Alpha fetoprotein is 5.2. beta-HCG is less than 2. LDH is 137.   Impression and Plan: Mr. Dannemiller is a 49 year old gentleman with a stage I seminoma of the left testicle. He had an orchiectomy back in January 2011. We did go ahead  and give him 2 cycles of adjuvant carboplatin. He completed this back in March of 2011.  I don't see any evidence of recurrent disease. I really think that he is cured. However, we will continue to follow him along per recommendations.  I'll see him back in 6 months.   Volanda Napoleon, MD 6/27/201610:42 AM

## 2015-04-27 NOTE — Progress Notes (Signed)
Electronically sent Lab work to Dr. Ivory Broad per patient's request.

## 2015-04-30 LAB — LACTATE DEHYDROGENASE: LDH: 151 U/L (ref 94–250)

## 2015-04-30 LAB — AFP TUMOR MARKER: AFP-Tumor Marker: 3.2 ng/mL (ref <6.1)

## 2015-04-30 LAB — BETA HCG QUANT (REF LAB)

## 2015-05-01 ENCOUNTER — Telehealth: Payer: Self-pay | Admitting: *Deleted

## 2015-05-01 NOTE — Telephone Encounter (Addendum)
Message left on personal voice mail  ----- Message from Volanda Napoleon, MD sent at 05/01/2015  6:51 AM EDT ----- Call - the tumor markers are all normal!!!  pete

## 2015-10-27 ENCOUNTER — Ambulatory Visit (HOSPITAL_BASED_OUTPATIENT_CLINIC_OR_DEPARTMENT_OTHER): Payer: 59 | Admitting: Hematology & Oncology

## 2015-10-27 ENCOUNTER — Other Ambulatory Visit (HOSPITAL_BASED_OUTPATIENT_CLINIC_OR_DEPARTMENT_OTHER): Payer: 59

## 2015-10-27 ENCOUNTER — Encounter: Payer: Self-pay | Admitting: Hematology & Oncology

## 2015-10-27 VITALS — BP 127/83 | HR 68 | Temp 97.5°F | Resp 16 | Ht 72.0 in | Wt 254.0 lb

## 2015-10-27 DIAGNOSIS — C6292 Malignant neoplasm of left testis, unspecified whether descended or undescended: Secondary | ICD-10-CM

## 2015-10-27 DIAGNOSIS — Z8547 Personal history of malignant neoplasm of testis: Secondary | ICD-10-CM | POA: Diagnosis not present

## 2015-10-27 LAB — COMPREHENSIVE METABOLIC PANEL
ALT: 62 U/L — ABNORMAL HIGH (ref 0–55)
ANION GAP: 7 meq/L (ref 3–11)
AST: 30 U/L (ref 5–34)
Albumin: 3.6 g/dL (ref 3.5–5.0)
Alkaline Phosphatase: 54 U/L (ref 40–150)
BILIRUBIN TOTAL: 0.72 mg/dL (ref 0.20–1.20)
BUN: 12.5 mg/dL (ref 7.0–26.0)
CHLORIDE: 107 meq/L (ref 98–109)
CO2: 26 meq/L (ref 22–29)
Calcium: 9 mg/dL (ref 8.4–10.4)
Creatinine: 0.9 mg/dL (ref 0.7–1.3)
EGFR: >90 mL/min/{1.73_m2} (ref >90)
Glucose: 110 mg/dl (ref 70–140)
Potassium: 5 mEq/L (ref 3.5–5.1)
Sodium: 139 mEq/L (ref 136–145)
TOTAL PROTEIN: 6.9 g/dL (ref 6.4–8.3)

## 2015-10-27 LAB — CBC WITH DIFFERENTIAL (CANCER CENTER ONLY)
BASO#: 0 10*3/uL (ref 0.0–0.2)
BASO%: 0.2 % (ref 0.0–2.0)
EOS ABS: 0.1 10*3/uL (ref 0.0–0.5)
EOS%: 2.4 % (ref 0.0–7.0)
HCT: 48.1 % (ref 38.7–49.9)
HGB: 16.1 g/dL (ref 13.0–17.1)
LYMPH#: 1.7 10*3/uL (ref 0.9–3.3)
LYMPH%: 33.4 % (ref 14.0–48.0)
MCH: 32.3 pg (ref 28.0–33.4)
MCHC: 33.5 g/dL (ref 32.0–35.9)
MCV: 96 fL (ref 82–98)
MONO#: 0.5 10*3/uL (ref 0.1–0.9)
MONO%: 9.8 % (ref 0.0–13.0)
NEUT#: 2.7 10*3/uL (ref 1.5–6.5)
NEUT%: 54.2 % (ref 40.0–80.0)
PLATELETS: 128 10*3/uL — AB (ref 145–400)
RBC: 4.99 10*6/uL (ref 4.20–5.70)
RDW: 12.9 % (ref 11.1–15.7)
WBC: 5 10*3/uL (ref 4.0–10.0)

## 2015-10-27 LAB — LACTATE DEHYDROGENASE: LDH: 150 U/L (ref 125–245)

## 2015-10-27 NOTE — Progress Notes (Signed)
Hematology and Oncology Follow Up Visit  Jesse George [id number] 1966-03-16 49 y.o. 10/27/2015   Principle Diagnosis:  Stage IB (T2, N0, M0) seminoma of the left testicle.  Current Therapy:    Observation     Interim History:  [name] is back for followup. He is doing quite well. He had a good summer and fall. Had a very nice Thanksgiving. Part of the summer, he was up in [place]. His girlfriend has children up there.  He's had no health issues. He's had no nausea or vomiting. There's not been any change in bowel or bladder habits. He's had no fever.  His last tumor markers all were within normal limits. His AFP is 3.2.  His beta-HCG is <2.0  He's had no fatigue or weakness. His appetite has been quite good. He's had no leg swelling. He's had no bleeding. He's had no headache.  Thankfully, all the legal issues that he had are now resolved.    Medications:  Current outpatient prescriptions:  .  sildenafil (VIAGRA) 50 MG tablet, Take 1 tablet (50 mg total) by mouth daily as needed for erectile dysfunction., Disp: 15 tablet, Rfl: 0  Allergies:  Allergies  Allergen Reactions  . Penicillins   . Tetanus Toxoids     Past Medical History, Surgical history, Social history, and Family History were reviewed and updated.  Review of Systems: As above  Physical Exam:  height is 6' (1.829 m) and weight is 254 lb (115.214 kg). His oral temperature is 97.5 F (36.4 C). His blood pressure is 127/83 and his pulse is 68. His respiration is 16.   Well-developed and well-nourished white woman in no obvious distress. Head and neck exam shows no ocular or oral lesion. There are no palpable cervical or supraclavicular lymph nodes. Lungs are clear. Cardiac exam regular rate and rhythm with no murmurs rubs or bruits. Abdomen is soft. Has good bowel sounds. He has a well-healed left orchiectomy scar. There is no palpable inguinal lymph nodes.. Back exam no tenderness over the spine ribs or  hips. Extremities shows no clubbing cyanosis or edema. Neurological exam shows no focal neurological deficits.  Lab Results  Component Value Date   WBC 5.0 10/27/2015   HGB 16.1 10/27/2015   HCT 48.1 10/27/2015   MCV 96 10/27/2015   PLT 128* 10/27/2015     Chemistry      Component Value Date/Time   NA 139 04/27/2015 0914   NA 142 10/11/2012 1125   K 4.0 04/27/2015 0914   K 4.6 10/11/2012 1125   CL 100 04/27/2015 0914   CL 103 10/11/2012 1125   CO2 27 04/27/2015 0914   CO2 22 10/11/2012 1125   BUN 11 04/27/2015 0914   BUN 19 10/11/2012 1125   CREATININE 1.0 04/27/2015 0914   CREATININE 0.87 10/11/2012 1125      Component Value Date/Time   CALCIUM 8.5 04/27/2015 0914   CALCIUM 9.3 10/11/2012 1125   ALKPHOS 53 04/27/2015 0914   ALKPHOS 53 10/11/2012 1125   AST 34 04/27/2015 0914   AST 17 10/11/2012 1125   ALT 52* 04/27/2015 0914   ALT 25 10/11/2012 1125   BILITOT 0.90 04/27/2015 0914   BILITOT 0.7 10/11/2012 1125      Alpha fetoprotein is 5.2. beta-HCG is less than 2. LDH is 137.   Impression and Plan: Jesse George is a 49 year old gentleman with a stage I seminoma of the left testicle. He had an orchiectomy back in January 2011. We  did go ahead and give him 2 cycles of adjuvant carboplatin. He completed this back in March of 2011.  I don't see any evidence of recurrent disease. I really think that he is cured. However, we will continue to follow him along per recommendations.  I'll see him back in 12 months.   Volanda Napoleon, MD 12/27/20169:44 AM

## 2015-10-30 LAB — AFP TUMOR MARKER: AFP-Tumor Marker: 4.1 ng/mL (ref <6.1)

## 2015-10-30 LAB — BETA HCG QUANT (REF LAB): Beta hCG, Tumor Marker: <2 m[IU]/mL (ref <5.0)

## 2016-08-24 IMAGING — CT CT ABD-PELV W/ CM
2 of 5 series · 16 of 46 positions shown, 18 images · IV contrast (omnipaque)
Comparison: 05/05/2014

CLINICAL DATA: Seminoma of left testis.

EXAM:
CT ABDOMEN AND PELVIS WITH CONTRAST
TECHNIQUE: Multidetector CT imaging of the abdomen and pelvis was performed
using the standard protocol following bolus administration of
intravenous contrast.
CONTRAST:  100mL OMNIPAQUE IOHEXOL 300 MG/ML  SOLN

[Series 2: abd/pelvis 5.0 b31f · axial · 0.81mm/px · z∈[-536,-36]mm · 13 of 112 slices shown, 15 images]
[im 6/112  soft-tissue]
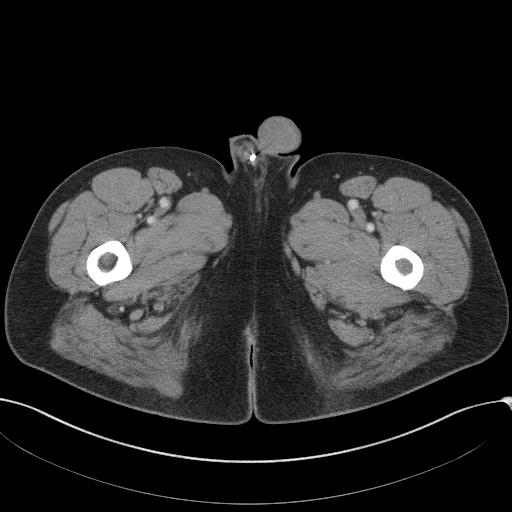
[im 6/112  bone]
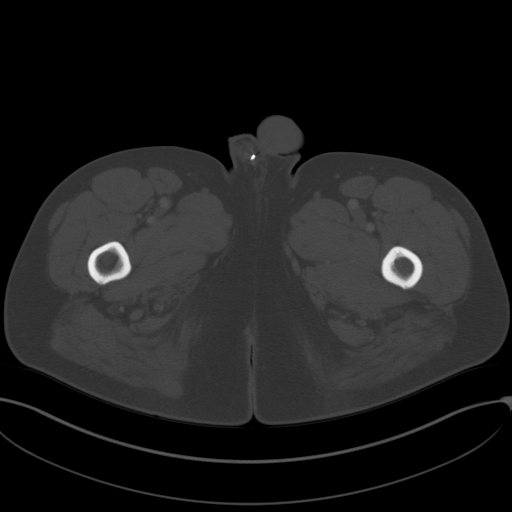
[im 18/112  soft-tissue]
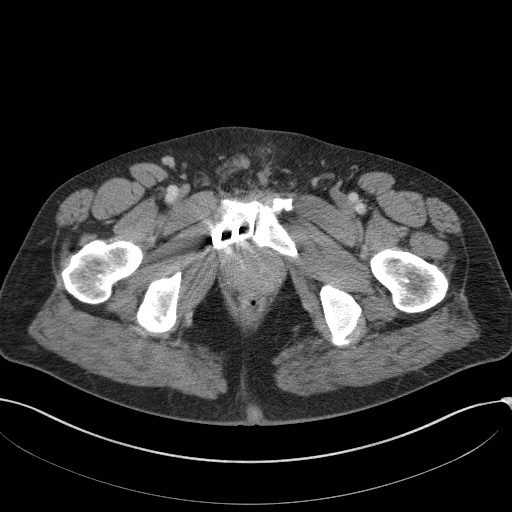
[im 24/112  soft-tissue]
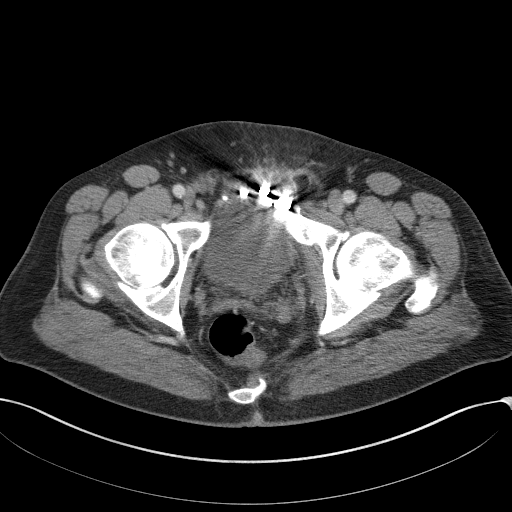
[im 30/112  soft-tissue]
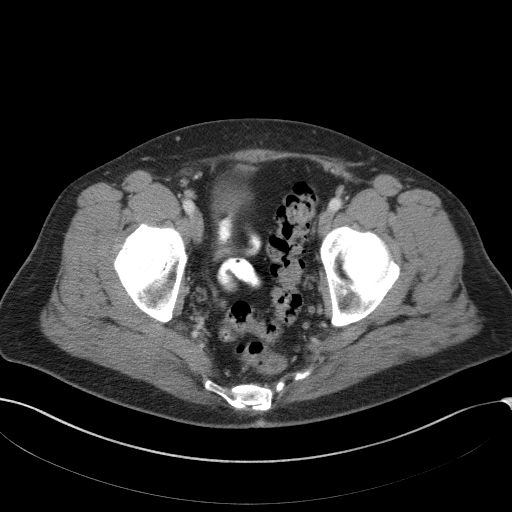
[im 41/112  soft-tissue]
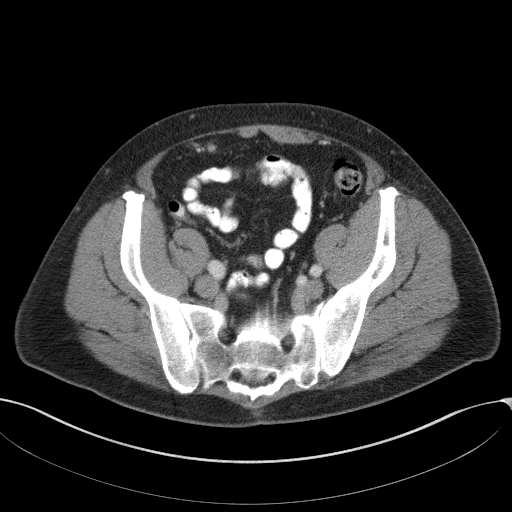
[im 47/112  soft-tissue]
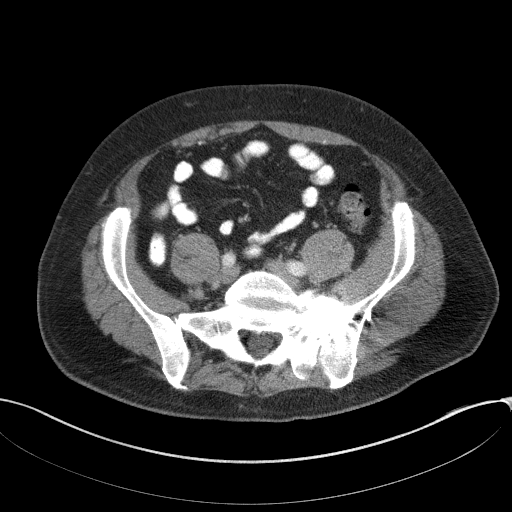
[im 59/112  soft-tissue]
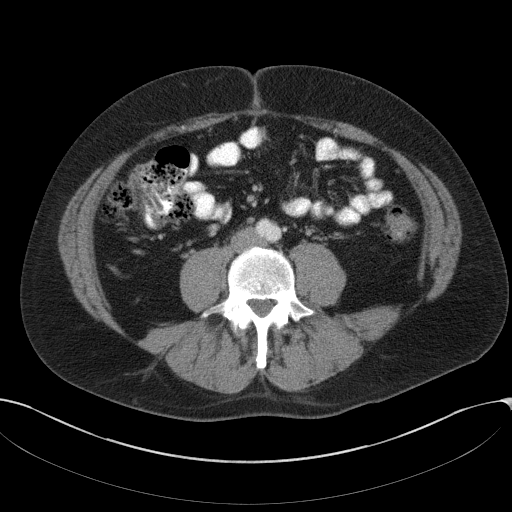
[im 65/112  soft-tissue]
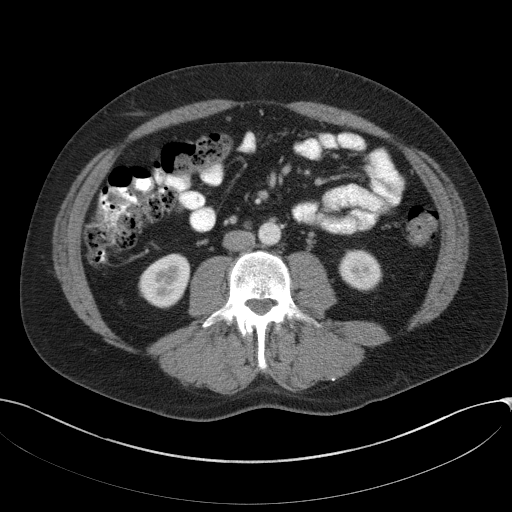
[im 71/112  soft-tissue]
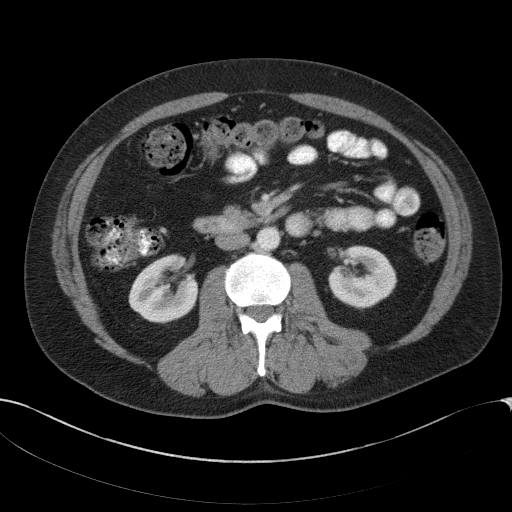
[im 71/112  bone]
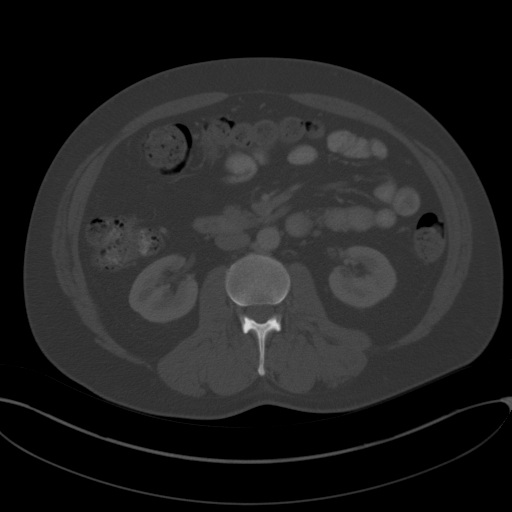
[im 82/112  soft-tissue]
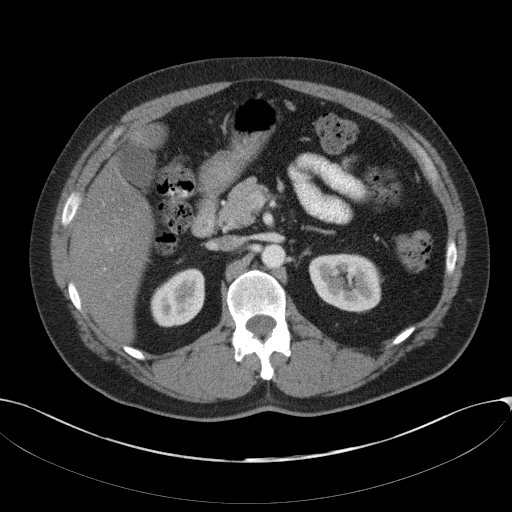
[im 88/112  soft-tissue]
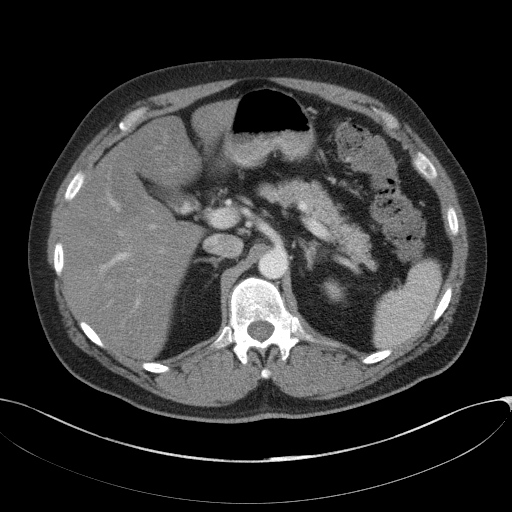
[im 94/112  soft-tissue]
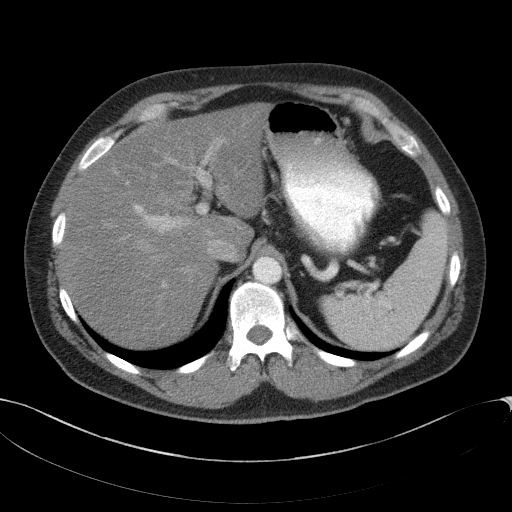
[im 106/112  soft-tissue]
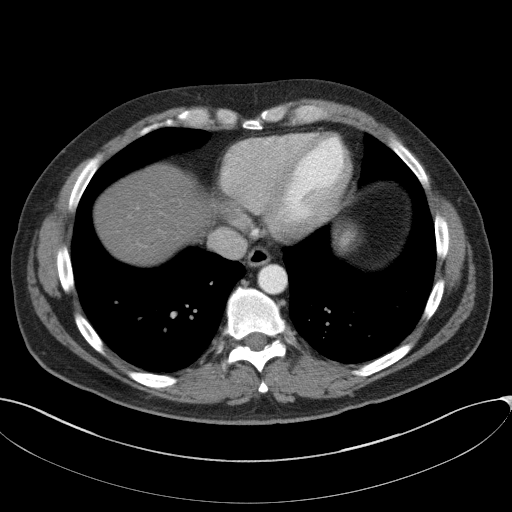

[Series 5: abd/pelvis 3.0 coronal · coronal · 0.83mm/px · 3 of 97 slices shown]
[im 33/97  soft-tissue]
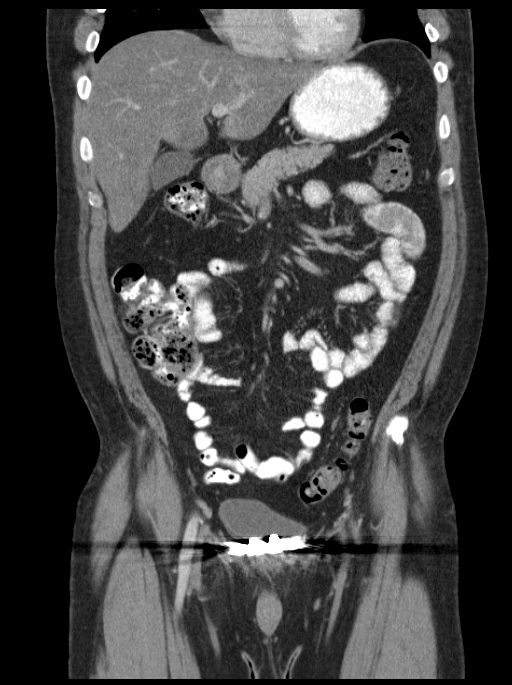
[im 43/97  soft-tissue]
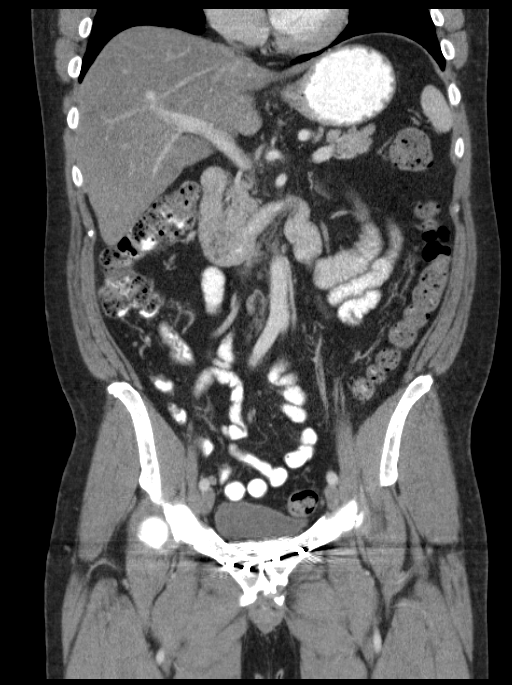
[im 54/97  soft-tissue]
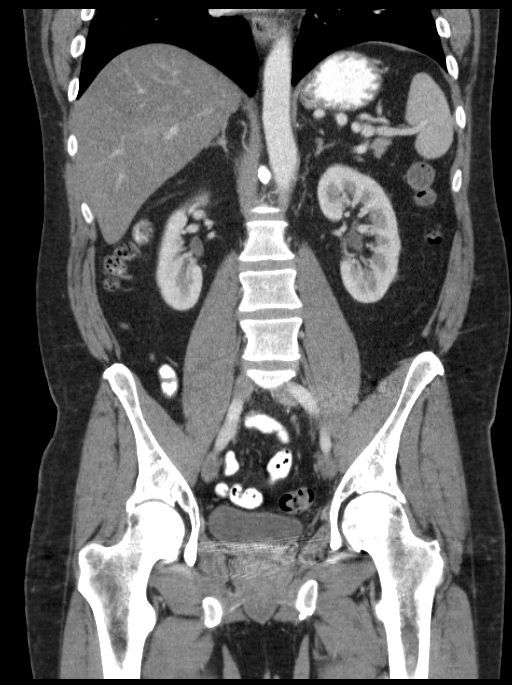

[16 of 46 positions shown; findings below may reference images not displayed]

FINDINGS: Lower chest:  No pleural effusion.  The lung bases are clear.

Hepatobiliary: Moderate hepatic steatosis identified. No suspicious
liver abnormality identified. Stones identified within the dependent
portion of the gallbladder. There is no biliary dilatation.

Pancreas: Appears normal.

Spleen: The spleen appears normal.

Adrenals/Urinary Tract: Normal adrenal glands. Normal appearance of
both kidneys. The urinary bladder appears within normal limits.

Stomach/Bowel: The stomach is within normal limits. The small bowel
loops have a normal course and caliber. No obstruction. Normal
appearance of the colon.

Vascular/Lymphatic: Normal caliber of the abdominal aorta. No
enlarged retroperitoneal or mesenteric adenopathy. No enlarged
pelvic or inguinal lymph nodes.

Reproductive: Prostate gland and seminal vesicles are unremarkable.

Other: There is no ascites or focal fluid collections within the
abdomen or pelvis. No peritoneal nodularity or mass identified.

Musculoskeletal: Previous hardware fixation of the pubic symphysis.
Syndesmotic screw is again noted traversing the left SI joint.
Nonspecific sclerotic lesion in the right iliac bone is stable
measuring 1.2 cm. No aggressive lytic or sclerotic bone lesions
identified.
IMPRESSION: 1. No acute findings within the abdomen or pelvis. No specific
features identified to suggest recurrent or metastatic disease.
2. Hepatic steatosis
3. Gallstones.

## 2016-10-04 DIAGNOSIS — K580 Irritable bowel syndrome with diarrhea: Secondary | ICD-10-CM | POA: Insufficient documentation

## 2016-10-21 ENCOUNTER — Ambulatory Visit (HOSPITAL_BASED_OUTPATIENT_CLINIC_OR_DEPARTMENT_OTHER): Payer: BLUE CROSS/BLUE SHIELD | Admitting: Hematology & Oncology

## 2016-10-21 ENCOUNTER — Other Ambulatory Visit (HOSPITAL_BASED_OUTPATIENT_CLINIC_OR_DEPARTMENT_OTHER): Payer: BLUE CROSS/BLUE SHIELD

## 2016-10-21 VITALS — BP 127/95 | HR 75 | Temp 97.9°F | Resp 16 | Wt 260.0 lb

## 2016-10-21 DIAGNOSIS — Z8547 Personal history of malignant neoplasm of testis: Secondary | ICD-10-CM | POA: Diagnosis not present

## 2016-10-21 DIAGNOSIS — C6292 Malignant neoplasm of left testis, unspecified whether descended or undescended: Secondary | ICD-10-CM

## 2016-10-21 LAB — COMPREHENSIVE METABOLIC PANEL
ALBUMIN: 3.8 g/dL (ref 3.5–5.0)
ALK PHOS: 69 U/L (ref 40–150)
ALT: 64 U/L — ABNORMAL HIGH (ref 0–55)
AST: 31 U/L (ref 5–34)
Anion Gap: 7 mEq/L (ref 3–11)
BILIRUBIN TOTAL: 0.79 mg/dL (ref 0.20–1.20)
BUN: 14.1 mg/dL (ref 7.0–26.0)
CO2: 24 meq/L (ref 22–29)
Calcium: 9 mg/dL (ref 8.4–10.4)
Chloride: 108 mEq/L (ref 98–109)
Creatinine: 0.8 mg/dL (ref 0.7–1.3)
GLUCOSE: 101 mg/dL (ref 70–140)
POTASSIUM: 4.3 meq/L (ref 3.5–5.1)
SODIUM: 138 meq/L (ref 136–145)
TOTAL PROTEIN: 7 g/dL (ref 6.4–8.3)

## 2016-10-21 LAB — CBC WITH DIFFERENTIAL (CANCER CENTER ONLY)
BASO#: 0 10*3/uL (ref 0.0–0.2)
BASO%: 0.2 % (ref 0.0–2.0)
EOS%: 2.1 % (ref 0.0–7.0)
Eosinophils Absolute: 0.1 10*3/uL (ref 0.0–0.5)
HCT: 47.8 % (ref 38.7–49.9)
HEMOGLOBIN: 16.3 g/dL (ref 13.0–17.1)
LYMPH#: 1.5 10*3/uL (ref 0.9–3.3)
LYMPH%: 28.7 % (ref 14.0–48.0)
MCH: 32.7 pg (ref 28.0–33.4)
MCHC: 34.1 g/dL (ref 32.0–35.9)
MCV: 96 fL (ref 82–98)
MONO#: 0.5 10*3/uL (ref 0.1–0.9)
MONO%: 9.3 % (ref 0.0–13.0)
NEUT%: 59.7 % (ref 40.0–80.0)
NEUTROS ABS: 3.2 10*3/uL (ref 1.5–6.5)
PLATELETS: 123 10*3/uL — AB (ref 145–400)
RBC: 4.98 10*6/uL (ref 4.20–5.70)
RDW: 13.1 % (ref 11.1–15.7)
WBC: 5.3 10*3/uL (ref 4.0–10.0)

## 2016-10-21 LAB — LACTATE DEHYDROGENASE: LDH: 145 U/L (ref 125–245)

## 2016-10-21 NOTE — Progress Notes (Signed)
Hematology and Oncology Follow Up Visit  Jesse George [id number] November 28, 1965 50 y.o. 10/21/2016   Principle Diagnosis:  Stage IB (T2, N0, M0) seminoma of the left testicle.  Current Therapy:    Observation     Interim History:  Jesse George is back for followup. He is doing quite well. He had a good summer and fall. Had a very nice Thanksgiving. In the early part of the summer, he went to [place]. This was the first time he was out of the country. This is for his 50th birthday. He really had a good time.   He is working part-time at [employer]. He really enjoys this.  He's had no health issues. He's had no nausea or vomiting. There's not been any change in bowel or bladder habits. He's had no fever.  His last tumor markers all were within normal limits. His AFP is 4.1.  His beta-HCG is <2.0  He's had no fatigue or weakness. His appetite has been quite good. He's had no leg swelling. He's had no bleeding. He's had no headache. Unfortunately, he has not lost any weight. He really needs to lose some weight. I talked to him about the risk of coronary artery disease and hyperlipidemia inpatients who has had testicular cancer.  Overall, his performance status is ECOG 0.    Medications:  Current Outpatient Prescriptions:  .  dicyclomine (BENTYL) 10 MG capsule, Take 10 capsules by mouth once., Disp: , Rfl:  .  sildenafil (VIAGRA) 50 MG tablet, Take 1 tablet (50 mg total) by mouth daily as needed for erectile dysfunction., Disp: 15 tablet, Rfl: 0  Allergies:  Allergies  Allergen Reactions  . Penicillins   . Tetanus Toxoid, Adsorbed   . Tetanus Toxoids     Past Medical History, Surgical history, Social history, and Family History were reviewed and updated.  Review of Systems: As above  Physical Exam:  weight is 260 lb (117.9 kg). His oral temperature is 97.9 F (36.6 C). His blood pressure is 127/95 (abnormal) and his pulse is 75. His respiration is 16.   Well-developed and  well-nourished white woman in no obvious distress. Head and neck exam shows no ocular or oral lesion. There are no palpable cervical or supraclavicular lymph nodes. Lungs are clear. Cardiac exam regular rate and rhythm with no murmurs rubs or bruits. Abdomen is soft. Has good bowel sounds. He has a well-healed left orchiectomy scar. There is no palpable inguinal lymph nodes.. Back exam no tenderness over the spine ribs or hips. Extremities shows no clubbing cyanosis or edema. Neurological exam shows no focal neurological deficits.  Lab Results  Component Value Date   WBC 5.3 10/21/2016   HGB 16.3 10/21/2016   HCT 47.8 10/21/2016   MCV 96 10/21/2016   PLT 123 (L) 10/21/2016     Chemistry      Component Value Date/Time   NA 139 10/27/2015 0908   K 5.0 10/27/2015 0908   CL 100 04/27/2015 0914   CO2 26 10/27/2015 0908   BUN 12.5 10/27/2015 0908   CREATININE 0.9 10/27/2015 0908      Component Value Date/Time   CALCIUM 9.0 10/27/2015 0908   ALKPHOS 54 10/27/2015 0908   AST 30 10/27/2015 0908   ALT 62 (H) 10/27/2015 0908   BILITOT 0.72 10/27/2015 0908      Impression and Plan: Jesse George is a 50 year old gentleman with a stage I seminoma of the left testicle. He had an orchiectomy back in January 2011.  We did go ahead and give him 2 cycles of adjuvant carboplatin. He completed this back in March of 2011.  I don't see any evidence of recurrent disease. I really think that he is cured. However, we will continue to follow him along per recommendations.  I am really glad that he had a great time this year. It was his 50th birthday. He went to [place]. This was the first time he was out of the country. He enjoyed himself.  I'll see him back in 12 months.   Volanda Napoleon, MD 12/22/20179:30 AM

## 2016-10-22 LAB — AFP TUMOR MARKER: AFP, Serum, Tumor Marker: 4.9 ng/mL (ref 0.0–8.3)

## 2016-10-22 LAB — BETA HCG QUANT (REF LAB)

## 2016-10-22 LAB — ALPHA FETO PROTEIN (PARALLEL TESTING): AFP TUMOR MARKER: 4.5 ng/mL (ref <6.1)

## 2016-10-25 ENCOUNTER — Telehealth: Payer: Self-pay | Admitting: *Deleted

## 2016-10-25 NOTE — Telephone Encounter (Addendum)
Patient aware of results   ----- Message from Volanda Napoleon, MD sent at 10/22/2016  8:24 AM EST ----- Call - tumor levels are normal!!  pete

## 2017-10-20 ENCOUNTER — Other Ambulatory Visit: Payer: Self-pay

## 2017-10-20 ENCOUNTER — Ambulatory Visit (HOSPITAL_BASED_OUTPATIENT_CLINIC_OR_DEPARTMENT_OTHER): Payer: BLUE CROSS/BLUE SHIELD | Admitting: Hematology & Oncology

## 2017-10-20 ENCOUNTER — Other Ambulatory Visit: Payer: BLUE CROSS/BLUE SHIELD

## 2017-10-20 ENCOUNTER — Encounter: Payer: Self-pay | Admitting: Hematology & Oncology

## 2017-10-20 VITALS — BP 132/88 | HR 73 | Temp 97.9°F | Resp 18 | Wt 261.0 lb

## 2017-10-20 DIAGNOSIS — Z85841 Personal history of malignant neoplasm of brain: Secondary | ICD-10-CM | POA: Diagnosis not present

## 2017-10-20 DIAGNOSIS — C6292 Malignant neoplasm of left testis, unspecified whether descended or undescended: Secondary | ICD-10-CM

## 2017-10-20 DIAGNOSIS — Z8547 Personal history of malignant neoplasm of testis: Secondary | ICD-10-CM | POA: Diagnosis not present

## 2017-10-20 LAB — CMP (CANCER CENTER ONLY)
ALK PHOS: 65 U/L (ref 26–84)
ALT: 42 U/L (ref 10–47)
AST: 28 U/L (ref 11–38)
Albumin: 3.6 g/dL (ref 3.3–5.5)
BILIRUBIN TOTAL: 0.9 mg/dL (ref 0.20–1.60)
BUN, Bld: 14 mg/dL (ref 7–22)
CALCIUM: 9.6 mg/dL (ref 8.0–10.3)
CO2: 29 mEq/L (ref 18–33)
CREATININE: 0.8 mg/dL (ref 0.6–1.2)
Chloride: 102 mEq/L (ref 98–108)
GLUCOSE: 115 mg/dL (ref 73–118)
Potassium: 4.6 mEq/L (ref 3.3–4.7)
SODIUM: 145 meq/L (ref 128–145)
Total Protein: 7.1 g/dL (ref 6.4–8.1)

## 2017-10-20 LAB — CBC WITH DIFFERENTIAL (CANCER CENTER ONLY)
BASO#: 0 10*3/uL (ref 0.0–0.2)
BASO%: 0.2 % (ref 0.0–2.0)
EOS%: 4.5 % (ref 0.0–7.0)
Eosinophils Absolute: 0.2 10*3/uL (ref 0.0–0.5)
HEMATOCRIT: 48.6 % (ref 38.7–49.9)
HGB: 16.4 g/dL (ref 13.0–17.1)
LYMPH#: 1.6 10*3/uL (ref 0.9–3.3)
LYMPH%: 31 % (ref 14.0–48.0)
MCH: 32.5 pg (ref 28.0–33.4)
MCHC: 33.7 g/dL (ref 32.0–35.9)
MCV: 96 fL (ref 82–98)
MONO#: 0.5 10*3/uL (ref 0.1–0.9)
MONO%: 9.6 % (ref 0.0–13.0)
NEUT%: 54.7 % (ref 40.0–80.0)
NEUTROS ABS: 2.8 10*3/uL (ref 1.5–6.5)
Platelets: 137 10*3/uL — ABNORMAL LOW (ref 145–400)
RBC: 5.05 10*6/uL (ref 4.20–5.70)
RDW: 13.4 % (ref 11.1–15.7)
WBC: 5.1 10*3/uL (ref 4.0–10.0)

## 2017-10-20 LAB — LACTATE DEHYDROGENASE: LDH: 150 U/L (ref 125–245)

## 2017-10-20 NOTE — Progress Notes (Signed)
Hematology and Oncology Follow Up Visit  Jesse George [id number] Jan 09, 1966 51 y.o. 10/20/2017   Principle Diagnosis:  Stage IB (T2, N0, M0) seminoma of the left testicle.  Current Therapy:    Observation     Interim History:  [name] is back for followup. He is doing quite well. He had a good summer and fall.  He and his girlfriend went on a river cruise in [place].  They had a wonderful time.  However, he seemed to come down with a parasitic infection.  He had a tough time coming home.  He saw his family doctor.  He was given antibiotics.  This is all better.  There is been no problems with recurrent testicular cancer.  His tumor markers have continued to be normal.  There is been no nausea or vomiting.  He has had no cough or shortness of breath.  He has had no rashes.  He has had no leg swelling.  He has had no headache.  Despite this intestinal infection, he had no fever.   Overall, his performance status is ECOG 0.    Medications:  Current Outpatient Medications:  .  dicyclomine (BENTYL) 10 MG capsule, Take 10 capsules by mouth once., Disp: , Rfl:  .  sildenafil (VIAGRA) 50 MG tablet, Take 1 tablet (50 mg total) by mouth daily as needed for erectile dysfunction., Disp: 15 tablet, Rfl: 0  Allergies:  Allergies  Allergen Reactions  . Penicillins Other (See Comments)  . Tetanus Toxoid, Adsorbed Other (See Comments)  . Tetanus Toxoids     Past Medical History, Surgical history, Social history, and Family History were reviewed and updated.  Review of Systems: Review of Systems  All other systems reviewed and are negative.   Physical Exam:  weight is 261 lb (118.4 kg). His oral temperature is 97.9 F (36.6 C). His blood pressure is 132/88 and his pulse is 73. His respiration is 18 and oxygen saturation is 99%.   Physical Exam  Constitutional: He is oriented to person, place, and time.  HENT:  Head: Normocephalic and atraumatic.  Mouth/Throat: Oropharynx is  clear and moist.  Eyes: EOM are normal. Pupils are equal, round, and reactive to light.  Neck: Normal range of motion.  Cardiovascular: Normal rate, regular rhythm and normal heart sounds.  Pulmonary/Chest: Effort normal and breath sounds normal.  Abdominal: Soft. Bowel sounds are normal.  Musculoskeletal: Normal range of motion. He exhibits no edema, tenderness or deformity.  Lymphadenopathy:    He has no cervical adenopathy.  Neurological: He is alert and oriented to person, place, and time.  Skin: Skin is warm and dry. No rash noted. No erythema.  Psychiatric: He has a normal mood and affect. His behavior is normal. Judgment and thought content normal.  Vitals reviewed.    Lab Results  Component Value Date   WBC 5.1 10/20/2017   HGB 16.4 10/20/2017   HCT 48.6 10/20/2017   MCV 96 10/20/2017   PLT 137 (L) 10/20/2017     Chemistry      Component Value Date/Time   NA 145 10/20/2017 0809   NA 138 10/21/2016 0850   K 4.6 10/20/2017 0809   K 4.3 10/21/2016 0850   CL 102 10/20/2017 0809   CO2 29 10/20/2017 0809   CO2 24 10/21/2016 0850   BUN 14 10/20/2017 0809   BUN 14.1 10/21/2016 0850   CREATININE 0.8 10/20/2017 0809   CREATININE 0.8 10/21/2016 0850      Component Value Date/Time  CALCIUM 9.6 10/20/2017 0809   CALCIUM 9.0 10/21/2016 0850   ALKPHOS 65 10/20/2017 0809   ALKPHOS 69 10/21/2016 0850   AST 28 10/20/2017 0809   AST 31 10/21/2016 0850   ALT 42 10/20/2017 0809   ALT 64 (H) 10/21/2016 0850   BILITOT 0.90 10/20/2017 0809   BILITOT 0.79 10/21/2016 0850      Impression and Plan: Jesse George is a 51 year old gentleman with a stage I seminoma of the left testicle. He had an orchiectomy back in January 2011. We did go ahead and give him 2 cycles of adjuvant carboplatin. He completed this back in March of 2011.  I don't see any evidence of recurrent disease. I really think that he is cured. However, we will continue to follow him along per recommendations.  We  will still get him back in 1 year.  I am so happy that he has is really wonderful relationship with this woman and is really making the most of it.  It sounds like they really have a wonderful time together.Volanda Napoleon, MD 12/21/20188:52 AM

## 2017-10-21 LAB — BETA HCG QUANT (REF LAB)

## 2017-10-21 LAB — AFP TUMOR MARKER: AFP, SERUM, TUMOR MARKER: 5.6 ng/mL (ref 0.0–8.3)

## 2017-10-25 ENCOUNTER — Telehealth: Payer: Self-pay | Admitting: *Deleted

## 2017-10-25 NOTE — Telephone Encounter (Addendum)
Patient is aware of results  ----- Message from Volanda Napoleon, MD sent at 10/23/2017  6:42 AM EST ----- Call - tumor markers are normal!!!!  Laurey Arrow

## 2018-10-19 ENCOUNTER — Encounter: Payer: Self-pay | Admitting: Hematology & Oncology

## 2018-10-19 ENCOUNTER — Inpatient Hospital Stay: Payer: BLUE CROSS/BLUE SHIELD

## 2018-10-19 ENCOUNTER — Inpatient Hospital Stay: Payer: BLUE CROSS/BLUE SHIELD | Attending: Hematology & Oncology | Admitting: Hematology & Oncology

## 2018-10-19 ENCOUNTER — Other Ambulatory Visit: Payer: Self-pay

## 2018-10-19 VITALS — BP 133/83 | HR 75 | Temp 98.1°F | Resp 16 | Wt 266.0 lb

## 2018-10-19 DIAGNOSIS — Z79899 Other long term (current) drug therapy: Secondary | ICD-10-CM | POA: Insufficient documentation

## 2018-10-19 DIAGNOSIS — C6292 Malignant neoplasm of left testis, unspecified whether descended or undescended: Secondary | ICD-10-CM | POA: Insufficient documentation

## 2018-10-19 DIAGNOSIS — Z9221 Personal history of antineoplastic chemotherapy: Secondary | ICD-10-CM | POA: Insufficient documentation

## 2018-10-19 LAB — CMP (CANCER CENTER ONLY)
ALT: 36 U/L (ref 0–44)
AST: 22 U/L (ref 15–41)
Albumin: 4 g/dL (ref 3.5–5.0)
Alkaline Phosphatase: 66 U/L (ref 38–126)
Anion gap: 6 (ref 5–15)
BUN: 12 mg/dL (ref 6–20)
CO2: 29 mmol/L (ref 22–32)
Calcium: 8.9 mg/dL (ref 8.9–10.3)
Chloride: 104 mmol/L (ref 98–111)
Creatinine: 0.93 mg/dL (ref 0.61–1.24)
GFR, Est AFR Am: >60 mL/min (ref >60)
GFR, Estimated: >60 mL/min (ref >60)
Glucose, Bld: 106 mg/dL — ABNORMAL HIGH (ref 70–99)
Potassium: 4.3 mmol/L (ref 3.5–5.1)
Sodium: 139 mmol/L (ref 135–145)
Total Bilirubin: 0.6 mg/dL (ref 0.3–1.2)
Total Protein: 6.5 g/dL (ref 6.5–8.1)

## 2018-10-19 LAB — CBC WITH DIFFERENTIAL (CANCER CENTER ONLY)
Abs Immature Granulocytes: 0.02 K/uL (ref 0.00–0.07)
Basophils Absolute: 0 K/uL (ref 0.0–0.1)
Basophils Relative: 1 %
Eosinophils Absolute: 0.1 K/uL (ref 0.0–0.5)
Eosinophils Relative: 2 %
HCT: 48.5 % (ref 39.0–52.0)
Hemoglobin: 15.6 g/dL (ref 13.0–17.0)
Immature Granulocytes: 0 %
Lymphocytes Relative: 32 %
Lymphs Abs: 1.6 K/uL (ref 0.7–4.0)
MCH: 30.5 pg (ref 26.0–34.0)
MCHC: 32.2 g/dL (ref 30.0–36.0)
MCV: 94.9 fL (ref 80.0–100.0)
Monocytes Absolute: 0.6 K/uL (ref 0.1–1.0)
Monocytes Relative: 11 %
Neutro Abs: 2.7 K/uL (ref 1.7–7.7)
Neutrophils Relative %: 54 %
Platelet Count: 185 K/uL (ref 150–400)
RBC: 5.11 MIL/uL (ref 4.22–5.81)
RDW: 13.7 % (ref 11.5–15.5)
WBC Count: 5 K/uL (ref 4.0–10.5)
nRBC: 0 % (ref 0.0–0.2)

## 2018-10-19 LAB — LACTATE DEHYDROGENASE: LDH: 141 U/L (ref 98–192)

## 2018-10-19 NOTE — Progress Notes (Signed)
Hematology and Oncology Follow Up Visit  Jesse George [id number] 09-Sep-1966 52 y.o. 10/19/2018   Principle Diagnosis:  Stage IB (T2, N0, M0) seminoma of the left testicle.  Current Therapy:    Observation     Interim History:  [name] is back for followup.  I see him yearly.  As always, he just got back from traveling.  He and his girlfriend and family went to [place] for Thanksgiving.  They had a wonderful time over there.  He is still working.  He does computer work.  He has had no problems with health.  Unfortunately, he will be forced to change doctors next year.  His insurance plan is basically uniting with [facility].  As such, I do not think he will be able to see [place].  He is not happy about this.  I am not sure what we can do to change this.  There is been no issues with bowels or bladder.  He has had no cough.  He has had no rashes.  His tumor markers are all normal.  His alpha-fetoprotein was 5.6.  His beta-hCG was less than 1.  Overall, his performance status is ECOG 0.    Medications:  Current Outpatient Medications:  .  dicyclomine (BENTYL) 10 MG capsule, Take 10 capsules by mouth once., Disp: , Rfl:  .  sildenafil (VIAGRA) 50 MG tablet, Take 1 tablet (50 mg total) by mouth daily as needed for erectile dysfunction., Disp: 15 tablet, Rfl: 0  Allergies:  Allergies  Allergen Reactions  . Penicillins Other (See Comments)  . Tetanus Toxoid, Adsorbed Other (See Comments)  . Tetanus Toxoids     Past Medical History, Surgical history, Social history, and Family History were reviewed and updated.  Review of Systems: Review of Systems  All other systems reviewed and are negative.   Physical Exam:  weight is 266 lb (120.7 kg). His oral temperature is 98.1 F (36.7 C). His blood pressure is 133/83 and his pulse is 75. His respiration is 16 and oxygen saturation is 99%.   Physical Exam Vitals signs reviewed.  HENT:     Head: Normocephalic and atraumatic.   Eyes:     Pupils: Pupils are equal, round, and reactive to light.  Neck:     Musculoskeletal: Normal range of motion.  Cardiovascular:     Rate and Rhythm: Normal rate and regular rhythm.     Heart sounds: Normal heart sounds.  Pulmonary:     Effort: Pulmonary effort is normal.     Breath sounds: Normal breath sounds.  Abdominal:     General: Bowel sounds are normal.     Palpations: Abdomen is soft.  Musculoskeletal: Normal range of motion.        General: No tenderness or deformity.  Lymphadenopathy:     Cervical: No cervical adenopathy.  Skin:    General: Skin is warm and dry.     Findings: No erythema or rash.  Neurological:     Mental Status: He is alert and oriented to person, place, and time.  Psychiatric:        Behavior: Behavior normal.        Thought Content: Thought content normal.        Judgment: Judgment normal.      Lab Results  Component Value Date   WBC 5.0 10/19/2018   HGB 15.6 10/19/2018   HCT 48.5 10/19/2018   MCV 94.9 10/19/2018   PLT 185 10/19/2018     Chemistry  Component Value Date/Time   NA 139 10/19/2018 0743   NA 145 10/20/2017 0809   NA 138 10/21/2016 0850   K 4.3 10/19/2018 0743   K 4.6 10/20/2017 0809   K 4.3 10/21/2016 0850   CL 104 10/19/2018 0743   CL 102 10/20/2017 0809   CO2 29 10/19/2018 0743   CO2 29 10/20/2017 0809   CO2 24 10/21/2016 0850   BUN 12 10/19/2018 0743   BUN 14 10/20/2017 0809   BUN 14.1 10/21/2016 0850   CREATININE 0.93 10/19/2018 0743   CREATININE 0.8 10/20/2017 0809   CREATININE 0.8 10/21/2016 0850      Component Value Date/Time   CALCIUM 8.9 10/19/2018 0743   CALCIUM 9.6 10/20/2017 0809   CALCIUM 9.0 10/21/2016 0850   ALKPHOS 66 10/19/2018 0743   ALKPHOS 65 10/20/2017 0809   ALKPHOS 69 10/21/2016 0850   AST 22 10/19/2018 0743   AST 31 10/21/2016 0850   ALT 36 10/19/2018 0743   ALT 42 10/20/2017 0809   ALT 64 (H) 10/21/2016 0850   BILITOT 0.6 10/19/2018 0743   BILITOT 0.79 10/21/2016  0850      Impression and Plan: Jesse George is a 52 year old gentleman with a stage I seminoma of the left testicle. He had an orchiectomy back in January 2011. We did go ahead and give him 2 cycles of adjuvant carboplatin. He completed this back in March of 2011.  I don't see any evidence of recurrent disease. I really think that he is cured. However, we will continue to follow him along per recommendations.  We will still get him back in 1 year.  I am so happy that he has a really wonderful relationship with this woman and is really making the most of it.  It sounds like they really have a wonderful time together.  I do not see any need for any scans.   Volanda Napoleon, MD 12/20/20198:23 AM

## 2018-10-20 LAB — AFP TUMOR MARKER: AFP, SERUM, TUMOR MARKER: 4.5 ng/mL (ref 0.0–8.3)

## 2018-10-20 LAB — BETA HCG QUANT (REF LAB)

## 2018-10-22 ENCOUNTER — Telehealth: Payer: Self-pay | Admitting: *Deleted

## 2018-10-22 NOTE — Telephone Encounter (Signed)
-----   Message from Volanda Napoleon, MD sent at 10/22/2018  7:55 AM EST ----- Call - the cancer marker levels are normal!!  Laurey Arrow

## 2018-10-22 NOTE — Telephone Encounter (Signed)
As noted below by Dr. Marin Olp, I informed the patient that the cancer marker levels are normal. He verbalized understanding.

## 2019-02-14 ENCOUNTER — Telehealth: Payer: Self-pay | Admitting: *Deleted

## 2019-02-14 NOTE — Telephone Encounter (Signed)
Notified pt we are unable to write letter for insurance purposes for his wellness visit. Pt verbalized understanding. No further concerns.

## 2019-10-18 ENCOUNTER — Other Ambulatory Visit: Payer: BLUE CROSS/BLUE SHIELD

## 2019-10-18 ENCOUNTER — Ambulatory Visit: Payer: BLUE CROSS/BLUE SHIELD | Admitting: Hematology & Oncology

## 2021-01-01 ENCOUNTER — Other Ambulatory Visit (HOSPITAL_COMMUNITY)
Admission: RE | Admit: 2021-01-01 | Discharge: 2021-01-01 | Disposition: A | Discharge status: Home / Self Care | Payer: 59 | Source: Ambulatory Visit | Attending: Orthopedic Surgery | Admitting: Orthopedic Surgery

## 2021-01-01 DIAGNOSIS — Z01812 Encounter for preprocedural laboratory examination: Secondary | ICD-10-CM | POA: Insufficient documentation

## 2021-01-01 DIAGNOSIS — Z20822 Contact with and (suspected) exposure to covid-19: Secondary | ICD-10-CM | POA: Diagnosis not present

## 2021-01-01 LAB — SARS CORONAVIRUS 2 (TAT 6-24 HRS): SARS Coronavirus 2: NEGATIVE

## 2021-01-04 ENCOUNTER — Encounter (HOSPITAL_COMMUNITY): Payer: Self-pay | Admitting: Orthopedic Surgery

## 2021-01-04 NOTE — Progress Notes (Signed)
Patient denies chest pain, shortness of breath, or cardiac test. States compliance with quarantine since COVID test. Educated on visitation policy.

## 2021-01-05 ENCOUNTER — Other Ambulatory Visit: Payer: Self-pay

## 2021-01-05 ENCOUNTER — Encounter (HOSPITAL_COMMUNITY): Payer: Self-pay | Admitting: Orthopedic Surgery

## 2021-01-05 ENCOUNTER — Ambulatory Visit (HOSPITAL_COMMUNITY): Payer: 59 | Admitting: Anesthesiology

## 2021-01-05 ENCOUNTER — Ambulatory Visit (HOSPITAL_COMMUNITY)
Admission: RE | Admit: 2021-01-05 | Discharge: 2021-01-05 | Disposition: A | Discharge status: Home / Self Care | Payer: 59 | Attending: Orthopedic Surgery | Admitting: Orthopedic Surgery

## 2021-01-05 ENCOUNTER — Encounter (HOSPITAL_COMMUNITY)
Admission: RE | Disposition: A | Discharge status: Home / Self Care | Payer: Self-pay | Source: Home / Self Care | Attending: Orthopedic Surgery

## 2021-01-05 DIAGNOSIS — Z880 Allergy status to penicillin: Secondary | ICD-10-CM | POA: Diagnosis not present

## 2021-01-05 DIAGNOSIS — Z887 Allergy status to serum and vaccine status: Secondary | ICD-10-CM | POA: Insufficient documentation

## 2021-01-05 DIAGNOSIS — M720 Palmar fascial fibromatosis [Dupuytren]: Secondary | ICD-10-CM | POA: Diagnosis not present

## 2021-01-05 HISTORY — PX: DUPUYTREN CONTRACTURE RELEASE: SHX1478

## 2021-01-05 HISTORY — DX: Personal history of other medical treatment: Z92.89

## 2021-01-05 SURGERY — RELEASE, DUPUYTREN CONTRACTURE
Anesthesia: Regional | Site: Hand | Laterality: Left

## 2021-01-05 MED ORDER — PROMETHAZINE HCL 25 MG/ML IJ SOLN: 6.2500 mg | INTRAMUSCULAR | Status: DC | PRN

## 2021-01-05 MED ORDER — FENTANYL CITRATE (PF) 100 MCG/2ML IJ SOLN: 25.0000 ug | INTRAMUSCULAR | Status: DC | PRN

## 2021-01-05 MED ORDER — PROPOFOL 10 MG/ML IV BOLUS
INTRAVENOUS | Status: DC | PRN
  Administered 2021-01-05 (×2): 10 mg via INTRAVENOUS

## 2021-01-05 MED ORDER — FENTANYL CITRATE (PF) 100 MCG/2ML IJ SOLN
INTRAMUSCULAR | Status: AC
  Administered 2021-01-05: 50 ug via INTRAVENOUS
  Filled 2021-01-05: qty 2

## 2021-01-05 MED ORDER — FENTANYL CITRATE (PF) 100 MCG/2ML IJ SOLN: 100.0000 ug | Freq: Once | INTRAMUSCULAR | Status: AC

## 2021-01-05 MED ORDER — 0.9 % SODIUM CHLORIDE (POUR BTL) OPTIME
TOPICAL | Status: DC | PRN
  Administered 2021-01-05: 1000 mL

## 2021-01-05 MED ORDER — CLINDAMYCIN PHOSPHATE 900 MG/50ML IV SOLN
900.0000 mg | INTRAVENOUS | Status: AC
  Administered 2021-01-05: 900 mg via INTRAVENOUS
  Filled 2021-01-05: qty 50

## 2021-01-05 MED ORDER — FENTANYL CITRATE (PF) 250 MCG/5ML IJ SOLN
INTRAMUSCULAR | Status: AC
  Filled 2021-01-05: qty 5

## 2021-01-05 MED ORDER — BUPIVACAINE HCL (PF) 0.25 % IJ SOLN
INTRAMUSCULAR | Status: DC | PRN
  Administered 2021-01-05: 30 mL

## 2021-01-05 MED ORDER — BUPIVACAINE HCL (PF) 0.25 % IJ SOLN
INTRAMUSCULAR | Status: AC
  Filled 2021-01-05: qty 30

## 2021-01-05 MED ORDER — BUPIVACAINE-EPINEPHRINE (PF) 0.5% -1:200000 IJ SOLN
INTRAMUSCULAR | Status: DC | PRN
  Administered 2021-01-05: 30 mL via PERINEURAL

## 2021-01-05 MED ORDER — OXYCODONE HCL 5 MG PO TABS: 5.0000 mg | ORAL_TABLET | Freq: Once | ORAL | Status: DC | PRN

## 2021-01-05 MED ORDER — OXYCODONE HCL 5 MG/5ML PO SOLN
5.0000 mg | Freq: Once | ORAL | Status: DC | PRN
Start: 2021-01-05 — End: 2021-01-07

## 2021-01-05 MED ORDER — MIDAZOLAM HCL 2 MG/2ML IJ SOLN
INTRAMUSCULAR | Status: AC
  Administered 2021-01-05: 2 mg via INTRAVENOUS
  Filled 2021-01-05: qty 2

## 2021-01-05 MED ORDER — LACTATED RINGERS IV SOLN: INTRAVENOUS | Status: DC

## 2021-01-05 MED ORDER — CHLORHEXIDINE GLUCONATE 0.12 % MT SOLN
OROMUCOSAL | Status: AC
  Administered 2021-01-05: 15 mL
  Filled 2021-01-05: qty 15

## 2021-01-05 MED ORDER — PROPOFOL 500 MG/50ML IV EMUL
INTRAVENOUS | Status: DC | PRN
  Administered 2021-01-05: 100 ug/kg/min via INTRAVENOUS

## 2021-01-05 MED ORDER — MIDAZOLAM HCL 2 MG/2ML IJ SOLN: 2.0000 mg | Freq: Once | INTRAMUSCULAR | Status: AC

## 2021-01-05 SURGICAL SUPPLY — 54 items
BNDG COHESIVE 1X5 TAN STRL LF (GAUZE/BANDAGES/DRESSINGS) IMPLANT
BNDG CONFORM 2 STRL LF (GAUZE/BANDAGES/DRESSINGS) IMPLANT
BNDG ELASTIC 3X5.8 VLCR STR LF (GAUZE/BANDAGES/DRESSINGS) ×2 IMPLANT
BNDG ELASTIC 4X5.8 VLCR STR LF (GAUZE/BANDAGES/DRESSINGS) ×2 IMPLANT
BNDG GAUZE ELAST 4 BULKY (GAUZE/BANDAGES/DRESSINGS) IMPLANT
CORD BIPOLAR FORCEPS 12FT (ELECTRODE) ×2 IMPLANT
COVER SURGICAL LIGHT HANDLE (MISCELLANEOUS) ×2 IMPLANT
COVER WAND RF STERILE (DRAPES) ×2 IMPLANT
CUFF TOURN SGL QUICK 18X4 (TOURNIQUET CUFF) IMPLANT
CUFF TOURN SGL QUICK 24 (TOURNIQUET CUFF) ×1
CUFF TRNQT CYL 24X4X16.5-23 (TOURNIQUET CUFF) ×1 IMPLANT
DECANTER SPIKE VIAL GLASS SM (MISCELLANEOUS) ×2 IMPLANT
DRAPE OEC MINIVIEW 54X84 (DRAPES) IMPLANT
DRAPE SURG 17X23 STRL (DRAPES) ×2 IMPLANT
DRSG EMULSION OIL 3X3 NADH (GAUZE/BANDAGES/DRESSINGS) ×2 IMPLANT
GAUZE SPONGE 2X2 8PLY STRL LF (GAUZE/BANDAGES/DRESSINGS) IMPLANT
GAUZE SPONGE 4X4 12PLY STRL (GAUZE/BANDAGES/DRESSINGS) ×2 IMPLANT
GAUZE XEROFORM 1X8 LF (GAUZE/BANDAGES/DRESSINGS) ×2 IMPLANT
GLOVE BIOGEL M 8.0 STRL (GLOVE) ×2 IMPLANT
GLOVE SS BIOGEL STRL SZ 8 (GLOVE) ×1 IMPLANT
GLOVE SUPERSENSE BIOGEL SZ 8 (GLOVE) ×1
GOWN STRL REUS W/ TWL LRG LVL3 (GOWN DISPOSABLE) ×2 IMPLANT
GOWN STRL REUS W/ TWL XL LVL3 (GOWN DISPOSABLE) ×1 IMPLANT
GOWN STRL REUS W/TWL LRG LVL3 (GOWN DISPOSABLE) ×2
GOWN STRL REUS W/TWL XL LVL3 (GOWN DISPOSABLE) ×1
KIT BASIN OR (CUSTOM PROCEDURE TRAY) ×2 IMPLANT
KIT TURNOVER KIT B (KITS) ×2 IMPLANT
MANIFOLD NEPTUNE II (INSTRUMENTS) IMPLANT
NEEDLE HYPO 25GX1X1/2 BEV (NEEDLE) ×2 IMPLANT
NS IRRIG 1000ML POUR BTL (IV SOLUTION) ×2 IMPLANT
PACK ORTHO EXTREMITY (CUSTOM PROCEDURE TRAY) ×2 IMPLANT
PAD ARMBOARD 7.5X6 YLW CONV (MISCELLANEOUS) ×4 IMPLANT
PAD CAST 3X4 CTTN HI CHSV (CAST SUPPLIES) ×1 IMPLANT
PAD CAST 4YDX4 CTTN HI CHSV (CAST SUPPLIES) IMPLANT
PADDING CAST COTTON 3X4 STRL (CAST SUPPLIES) ×1
PADDING CAST COTTON 4X4 STRL (CAST SUPPLIES)
SLING ARM FOAM STRAP XLG (SOFTGOODS) ×2 IMPLANT
SOL PREP POV-IOD 4OZ 10% (MISCELLANEOUS) ×2 IMPLANT
SPECIMEN JAR SMALL (MISCELLANEOUS) ×2 IMPLANT
SPLINT FIBERGLASS 3X12 (CAST SUPPLIES) ×2 IMPLANT
SPONGE GAUZE 2X2 STER 10/PKG (GAUZE/BANDAGES/DRESSINGS)
SUCTION FRAZIER HANDLE 10FR (MISCELLANEOUS)
SUCTION TUBE FRAZIER 10FR DISP (MISCELLANEOUS) IMPLANT
SUT CHROMIC 5 0 P 3 (SUTURE) ×2 IMPLANT
SUT MERSILENE 4 0 P 3 (SUTURE) IMPLANT
SUT PROLENE 4 0 PS 2 18 (SUTURE) ×8 IMPLANT
SUT VIC AB 2-0 CT1 27 (SUTURE)
SUT VIC AB 2-0 CT1 TAPERPNT 27 (SUTURE) IMPLANT
SYR CONTROL 10ML LL (SYRINGE) ×2 IMPLANT
TOWEL GREEN STERILE (TOWEL DISPOSABLE) ×2 IMPLANT
TOWEL GREEN STERILE FF (TOWEL DISPOSABLE) ×2 IMPLANT
TUBE CONNECTING 12X1/4 (SUCTIONS) IMPLANT
UNDERPAD 30X36 HEAVY ABSORB (UNDERPADS AND DIAPERS) ×2 IMPLANT
WATER STERILE IRR 1000ML POUR (IV SOLUTION) ×2 IMPLANT

## 2021-01-05 NOTE — Anesthesia Postprocedure Evaluation (Signed)
Anesthesia Post Note  Patient: Jesse George  Procedure(s) Performed: Subtotal palmar fasciectomy left ring finger and palm with neuroplasty and repair as necessary. (Left Hand)     Patient location during evaluation: PACU Anesthesia Type: Regional Level of consciousness: awake and alert Pain management: pain level controlled Vital Signs Assessment: post-procedure vital signs reviewed and stable Respiratory status: spontaneous breathing, nonlabored ventilation and respiratory function stable Cardiovascular status: stable and blood pressure returned to baseline Anesthetic complications: no   No complications documented.  Last Vitals:  Vitals:   01/05/21 1341 01/05/21 1356  BP: 137/73 132/87  Pulse: 79 74  Resp: 20 18  Temp:  (!) 36.1 C  SpO2: 97% 97%    Last Pain:  Vitals:   01/05/21 1356  TempSrc:   PainSc: 0-No pain                 Audry Pili

## 2021-01-05 NOTE — Anesthesia Preprocedure Evaluation (Addendum)
Anesthesia Evaluation  Patient identified by MRN, date of birth, ID band Patient awake    Reviewed: Allergy & Precautions, NPO status , Patient's Chart, lab work & pertinent test results  History of Anesthesia Complications Negative for: history of anesthetic complications  Airway Mallampati: II  TM Distance: >3 FB Neck ROM: Full    Dental  (+) Dental Advisory Given, Partial Lower   Pulmonary neg pulmonary ROS,    Pulmonary exam normal        Cardiovascular negative cardio ROS Normal cardiovascular exam     Neuro/Psych negative neurological ROS  negative psych ROS   GI/Hepatic Neg liver ROS,  IBS    Endo/Other   Obesity   Renal/GU negative Renal ROS    Testicular cancer s/p orchiectomy     Musculoskeletal negative musculoskeletal ROS (+)   Abdominal   Peds  Hematology negative hematology ROS (+)   Anesthesia Other Findings Covid test negative   Reproductive/Obstetrics                            Anesthesia Physical Anesthesia Plan  ASA: II  Anesthesia Plan: Regional   Post-op Pain Management:    Induction: Intravenous  PONV Risk Score and Plan: 1 and Propofol infusion and Treatment may vary due to age or medical condition  Airway Management Planned: Natural Airway and Simple Face Mask  Additional Equipment: None  Intra-op Plan:   Post-operative Plan:   Informed Consent: I have reviewed the patients History and Physical, chart, labs and discussed the procedure including the risks, benefits and alternatives for the proposed anesthesia with the patient or authorized representative who has indicated his/her understanding and acceptance.       Plan Discussed with: CRNA and Anesthesiologist  Anesthesia Plan Comments:        Anesthesia Quick Evaluation

## 2021-01-05 NOTE — Progress Notes (Signed)
Called Dr Fransisco Beau to see if any labs are needed for this procedure.  Per Dr Fransisco Beau, no labs needed on this patient.  Verified Order.

## 2021-01-05 NOTE — Anesthesia Procedure Notes (Signed)
Anesthesia Regional Block: Axillary brachial plexus block   Pre-Anesthetic Checklist: ,, timeout performed, Correct Patient, Correct Site, Correct Laterality, Correct Procedure, Correct Position, site marked, Risks and benefits discussed,  Surgical consent,  Pre-op evaluation,  At surgeon's request and post-op pain management  Laterality: Left  Prep: chloraprep       Needles:  Injection technique: Single-shot  Needle Type: Echogenic Needle     Needle Length: 5cm  Needle Gauge: 21     Additional Needles:   Narrative:  Start time: 01/05/2021 11:24 AM End time: 01/05/2021 11:28 AM Injection made incrementally with aspirations every 5 mL.  Performed by: Personally  Anesthesiologist: Audry Pili, MD  Additional Notes: No pain on injection. No increased resistance to injection. Injection made in 5cc increments. Good needle visualization. Patient tolerated the procedure well.
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
## 2021-01-05 NOTE — Anesthesia Procedure Notes (Signed)
Procedure Name: MAC Date/Time: 01/05/2021 11:48 AM Performed by: Janene Harvey, CRNA Pre-anesthesia Checklist: Patient identified, Emergency Drugs available, Suction available and Patient being monitored Patient Re-evaluated:Patient Re-evaluated prior to induction Oxygen Delivery Method: Simple face mask Induction Type: IV induction Placement Confirmation: positive ETCO2 Dental Injury: Teeth and Oropharynx as per pre-operative assessment

## 2021-01-05 NOTE — Discharge Instructions (Signed)
Please elevate move and massage her fingers within the confines of the splint.  We will see you in 7 days.  Your surgery went wonderful.  You may expect variance a little numbness of the tips of your fingers initially but this should resolve over time.  All went excellent today.  Please remember to take a stool softener with the pain medicine as it will constipate you.  We encourage vitamin C for wound healing at 1000 mg a day.  You may drive a car once you are off pain medicine and thinking clearly.

## 2021-01-05 NOTE — Transfer of Care (Signed)
Immediate Anesthesia Transfer of Care Note  Patient: Jesse George  Procedure(s) Performed: Subtotal palmar fasciectomy left ring finger and palm with neuroplasty and repair as necessary. (Left Hand)  Patient Location: PACU  Anesthesia Type:MAC combined with regional for post-op pain  Level of Consciousness: awake, alert  and oriented  Airway & Oxygen Therapy: Patient Spontanous Breathing  Post-op Assessment: Report given to RN, Post -op Vital signs reviewed and stable and Patient moving all extremities  Post vital signs: Reviewed and stable  Last Vitals:  Vitals Value Taken Time  BP 141/80 01/05/21 1326  Temp    Pulse 78 01/05/21 1326  Resp 14 01/05/21 1326  SpO2 99 % 01/05/21 1326  Vitals shown include unvalidated device data.  Last Pain:  Vitals:   01/05/21 1135  TempSrc:   PainSc: 0-No pain      Patients Stated Pain Goal: 3 (76/14/70 9295)  Complications: No complications documented.

## 2021-01-05 NOTE — Op Note (Signed)
Operative note January 05, 2021  Roseanne Kaufman MD  Preoperative diagnosis left ring and palm Dupuytren's contracture extensive in nature  Postop diagnosis the same  Operative procedure #1 subtotal palmar fasciectomy left palm and ring finger secondary to Dupuytren's disease extensive in nature.  #2 neuroplasty radial and ulnar digital nerves about the left ring finger and palm #3 manipulation under anesthesia left ring finger  Surgeon Roseanne Kaufman Assistant none  Anesthesia block with IV sedation  Estimated blood loss minimal  Tourniquet time less than an hour  Description of procedure the patient is a very pleasant male was undergone a successful right hand Dupuytren's contracture release.  He presents with extensive Dupuytren's contracture about the left ring finger and palm.  We will plan to proceed with surgical reconstructive efforts.  I discussed with the patient all issues.  Patient was taken to the operative theater underwent Hibiclens followed by Betadine scrub and paint once this was complete the patient then underwent a very careful and cautious approach to the extremity with elevation of the arm.  Timeout was called preoperative antibiotics were given.  Following this the patient then underwent a very careful and cautious approach to the finger with modified Bruner incision.  Skin flaps were elevated.  The patient had a significant dimple about the ring finger MCP joint with invaginated tissue.  I very carefully elevated this.  Skin flaps were elevated and once this was complete we then performed identification of the disease.  The disease included portions of spiral cords radial and ulnar as well as natatory cords and pretendinous cords.  Neuroplasty of the radial and ulnar digital nerves was accomplished.  This started in the palm and went down into the left ring finger.  I was very careful with the radial and ulnar digital nerves and arteries.  Superficial palmar arch was  identified and carefully protected as well.  This time we then excised the pretendinous cord natatory cord as well as the spiral cord investments.  Nerves were protected.  Manipulation under anesthesia allowed for full restoration of extension and there were no complications.  Area was irrigated tourniquet was deflated excellent refill was noted and there were no complicating features.  The wound was closed with inverted horizontal mattress sutures.  He will be discharged home on Robaxin oxycodone and Keflex he will notify should any problems occur.  Spent a pleasure dissipate in his care we look forward to participating in his postop care to include therapy in 1 week and suture removal in 2 weeks.  Lamonda Noxon MD

## 2021-01-06 ENCOUNTER — Encounter (HOSPITAL_COMMUNITY): Payer: Self-pay | Admitting: Orthopedic Surgery

## 2021-01-06 LAB — SURGICAL PATHOLOGY

## 2021-06-25 ENCOUNTER — Other Ambulatory Visit: Payer: Self-pay

## 2021-06-25 ENCOUNTER — Encounter (HOSPITAL_COMMUNITY): Payer: Self-pay | Admitting: Orthopedic Surgery

## 2021-06-25 NOTE — Progress Notes (Signed)
Spoke with pt for pre-op call. Pt denies cardiac history, HTN or Diabetes.  Pt's surgery is scheduled as ambulatory so no Covid test is required prior to surgery.

## 2021-06-29 ENCOUNTER — Other Ambulatory Visit: Payer: Self-pay

## 2021-06-29 ENCOUNTER — Encounter (HOSPITAL_COMMUNITY)
Admission: RE | Disposition: A | Discharge status: Home / Self Care | Payer: Self-pay | Source: Home / Self Care | Attending: Orthopedic Surgery

## 2021-06-29 ENCOUNTER — Ambulatory Visit (HOSPITAL_COMMUNITY)
Admission: RE | Admit: 2021-06-29 | Discharge: 2021-06-29 | Disposition: A | Discharge status: Home / Self Care | Payer: 59 | Attending: Orthopedic Surgery | Admitting: Orthopedic Surgery

## 2021-06-29 ENCOUNTER — Ambulatory Visit (HOSPITAL_COMMUNITY): Payer: 59 | Admitting: Anesthesiology

## 2021-06-29 DIAGNOSIS — Z880 Allergy status to penicillin: Secondary | ICD-10-CM | POA: Diagnosis not present

## 2021-06-29 DIAGNOSIS — D2111 Benign neoplasm of connective and other soft tissue of right upper limb, including shoulder: Secondary | ICD-10-CM | POA: Insufficient documentation

## 2021-06-29 DIAGNOSIS — E669 Obesity, unspecified: Secondary | ICD-10-CM | POA: Diagnosis not present

## 2021-06-29 DIAGNOSIS — Z79899 Other long term (current) drug therapy: Secondary | ICD-10-CM | POA: Insufficient documentation

## 2021-06-29 DIAGNOSIS — M720 Palmar fascial fibromatosis [Dupuytren]: Secondary | ICD-10-CM | POA: Insufficient documentation

## 2021-06-29 DIAGNOSIS — Z6830 Body mass index (BMI) 30.0-30.9, adult: Secondary | ICD-10-CM | POA: Insufficient documentation

## 2021-06-29 DIAGNOSIS — Z887 Allergy status to serum and vaccine status: Secondary | ICD-10-CM | POA: Insufficient documentation

## 2021-06-29 HISTORY — PX: DUPUYTREN CONTRACTURE RELEASE: SHX1478

## 2021-06-29 LAB — CBC
HCT: 49.2 % (ref 39.0–52.0)
Hemoglobin: 16.3 g/dL (ref 13.0–17.0)
MCH: 31.7 pg (ref 26.0–34.0)
MCHC: 33.1 g/dL (ref 30.0–36.0)
MCV: 95.5 fL (ref 80.0–100.0)
Platelets: 165 10*3/uL (ref 150–400)
RBC: 5.15 MIL/uL (ref 4.22–5.81)
RDW: 12.9 % (ref 11.5–15.5)
WBC: 4.9 10*3/uL (ref 4.0–10.5)
nRBC: 0 % (ref 0.0–0.2)

## 2021-06-29 LAB — BASIC METABOLIC PANEL
Anion gap: 8 (ref 5–15)
BUN: 12 mg/dL (ref 6–20)
CO2: 25 mmol/L (ref 22–32)
Calcium: 9.1 mg/dL (ref 8.9–10.3)
Chloride: 105 mmol/L (ref 98–111)
Creatinine, Ser: 0.87 mg/dL (ref 0.61–1.24)
GFR, Estimated: >60 mL/min (ref >60)
Glucose, Bld: 95 mg/dL (ref 70–99)
Potassium: 4 mmol/L (ref 3.5–5.1)
Sodium: 138 mmol/L (ref 135–145)

## 2021-06-29 SURGERY — RELEASE, DUPUYTREN CONTRACTURE
Anesthesia: Monitor Anesthesia Care | Site: Hand | Laterality: Right

## 2021-06-29 MED ORDER — ORAL CARE MOUTH RINSE: 15.0000 mL | Freq: Once | OROMUCOSAL | Status: AC

## 2021-06-29 MED ORDER — MIDAZOLAM HCL 5 MG/5ML IJ SOLN
INTRAMUSCULAR | Status: DC | PRN
  Administered 2021-06-29: 2 mg via INTRAVENOUS

## 2021-06-29 MED ORDER — FENTANYL CITRATE (PF) 250 MCG/5ML IJ SOLN
INTRAMUSCULAR | Status: AC
  Filled 2021-06-29: qty 5

## 2021-06-29 MED ORDER — FENTANYL CITRATE (PF) 100 MCG/2ML IJ SOLN
INTRAMUSCULAR | Status: DC | PRN
  Administered 2021-06-29: 50 ug via INTRAVENOUS
  Administered 2021-06-29: 100 ug via INTRAVENOUS
  Administered 2021-06-29 (×2): 50 ug via INTRAVENOUS

## 2021-06-29 MED ORDER — BUPIVACAINE-EPINEPHRINE (PF) 0.5% -1:200000 IJ SOLN
INTRAMUSCULAR | Status: DC | PRN
  Administered 2021-06-29: 30 mL via PERINEURAL

## 2021-06-29 MED ORDER — PROPOFOL 10 MG/ML IV BOLUS
INTRAVENOUS | Status: DC | PRN
  Administered 2021-06-29: 50 mg via INTRAVENOUS

## 2021-06-29 MED ORDER — SULFAMETHOXAZOLE-TRIMETHOPRIM 800-160 MG PO TABS: 1.0000 | ORAL_TABLET | Freq: Two times a day (BID) | ORAL | 0 refills | Status: AC

## 2021-06-29 MED ORDER — 0.9 % SODIUM CHLORIDE (POUR BTL) OPTIME
TOPICAL | Status: DC | PRN
  Administered 2021-06-29: 1000 mL

## 2021-06-29 MED ORDER — DEXAMETHASONE SODIUM PHOSPHATE 10 MG/ML IJ SOLN
INTRAMUSCULAR | Status: DC | PRN
  Administered 2021-06-29: 10 mg via INTRAVENOUS

## 2021-06-29 MED ORDER — LACTATED RINGERS IV SOLN: INTRAVENOUS | Status: DC

## 2021-06-29 MED ORDER — PROPOFOL 500 MG/50ML IV EMUL
INTRAVENOUS | Status: DC | PRN
  Administered 2021-06-29: 100 ug/kg/min via INTRAVENOUS

## 2021-06-29 MED ORDER — ONDANSETRON HCL 4 MG/2ML IJ SOLN
INTRAMUSCULAR | Status: DC | PRN
  Administered 2021-06-29: 4 mg via INTRAVENOUS

## 2021-06-29 MED ORDER — ACETAMINOPHEN 500 MG PO TABS
1000.0000 mg | ORAL_TABLET | Freq: Once | ORAL | Status: AC
  Administered 2021-06-29: 1000 mg via ORAL
  Filled 2021-06-29: qty 2

## 2021-06-29 MED ORDER — PROPOFOL 10 MG/ML IV BOLUS
INTRAVENOUS | Status: AC
  Filled 2021-06-29: qty 20

## 2021-06-29 MED ORDER — ONDANSETRON HCL 4 MG PO TABS: 4.0000 mg | ORAL_TABLET | Freq: Every day | ORAL | 1 refills | Status: DC | PRN

## 2021-06-29 MED ORDER — CHLORHEXIDINE GLUCONATE 0.12 % MT SOLN
15.0000 mL | Freq: Once | OROMUCOSAL | Status: AC
  Administered 2021-06-29: 15 mL via OROMUCOSAL
  Filled 2021-06-29: qty 15

## 2021-06-29 MED ORDER — MIDAZOLAM HCL 2 MG/2ML IJ SOLN
INTRAMUSCULAR | Status: AC
  Filled 2021-06-29: qty 2

## 2021-06-29 MED ORDER — VANCOMYCIN HCL IN DEXTROSE 1-5 GM/200ML-% IV SOLN
1000.0000 mg | INTRAVENOUS | Status: AC
  Administered 2021-06-29: 1000 mg via INTRAVENOUS
  Filled 2021-06-29: qty 200

## 2021-06-29 MED ORDER — OXYCODONE HCL 5 MG PO TABS: 5.0000 mg | ORAL_TABLET | ORAL | 0 refills | Status: DC | PRN

## 2021-06-29 SURGICAL SUPPLY — 49 items
BAG COUNTER SPONGE SURGICOUNT (BAG) ×2 IMPLANT
BNDG COHESIVE 1X5 TAN STRL LF (GAUZE/BANDAGES/DRESSINGS) IMPLANT
BNDG CONFORM 2 STRL LF (GAUZE/BANDAGES/DRESSINGS) IMPLANT
BNDG ELASTIC 3X5.8 VLCR STR LF (GAUZE/BANDAGES/DRESSINGS) IMPLANT
BNDG ELASTIC 4X5.8 VLCR STR LF (GAUZE/BANDAGES/DRESSINGS) ×2 IMPLANT
BNDG GAUZE ELAST 4 BULKY (GAUZE/BANDAGES/DRESSINGS) ×2 IMPLANT
CORD BIPOLAR FORCEPS 12FT (ELECTRODE) ×2 IMPLANT
COVER SURGICAL LIGHT HANDLE (MISCELLANEOUS) ×2 IMPLANT
CUFF TOURN SGL QUICK 18X4 (TOURNIQUET CUFF) ×2 IMPLANT
CUFF TOURN SGL QUICK 24 (TOURNIQUET CUFF)
CUFF TRNQT CYL 24X4X16.5-23 (TOURNIQUET CUFF) IMPLANT
DECANTER SPIKE VIAL GLASS SM (MISCELLANEOUS) ×2 IMPLANT
DRAPE OEC MINIVIEW 54X84 (DRAPES) IMPLANT
DRAPE SURG 17X23 STRL (DRAPES) ×2 IMPLANT
DRSG EMULSION OIL 3X3 NADH (GAUZE/BANDAGES/DRESSINGS) ×4 IMPLANT
GAUZE SPONGE 2X2 8PLY STRL LF (GAUZE/BANDAGES/DRESSINGS) IMPLANT
GAUZE SPONGE 4X4 12PLY STRL (GAUZE/BANDAGES/DRESSINGS) ×2 IMPLANT
GAUZE XEROFORM 1X8 LF (GAUZE/BANDAGES/DRESSINGS) ×4 IMPLANT
GLOVE SURG ENC TEXT LTX SZ8 (GLOVE) ×2 IMPLANT
GLOVE SURG MICRO LTX SZ8 (GLOVE) ×2 IMPLANT
GOWN STRL REUS W/ TWL LRG LVL3 (GOWN DISPOSABLE) ×2 IMPLANT
GOWN STRL REUS W/ TWL XL LVL3 (GOWN DISPOSABLE) ×3 IMPLANT
GOWN STRL REUS W/TWL LRG LVL3 (GOWN DISPOSABLE) ×2
GOWN STRL REUS W/TWL XL LVL3 (GOWN DISPOSABLE) ×3
KIT BASIN OR (CUSTOM PROCEDURE TRAY) ×2 IMPLANT
KIT TURNOVER KIT B (KITS) ×2 IMPLANT
MANIFOLD NEPTUNE II (INSTRUMENTS) ×2 IMPLANT
NEEDLE HYPO 25GX1X1/2 BEV (NEEDLE) IMPLANT
NS IRRIG 1000ML POUR BTL (IV SOLUTION) ×2 IMPLANT
PACK ORTHO EXTREMITY (CUSTOM PROCEDURE TRAY) ×2 IMPLANT
PAD ARMBOARD 7.5X6 YLW CONV (MISCELLANEOUS) ×4 IMPLANT
PAD CAST 4YDX4 CTTN HI CHSV (CAST SUPPLIES) ×1 IMPLANT
PADDING CAST COTTON 4X4 STRL (CAST SUPPLIES) ×1
SOL PREP POV-IOD 4OZ 10% (MISCELLANEOUS) ×6 IMPLANT
SPECIMEN JAR SMALL (MISCELLANEOUS) ×2 IMPLANT
SPLINT FIBERGLASS 3X12 (CAST SUPPLIES) ×2 IMPLANT
SPONGE GAUZE 2X2 STER 10/PKG (GAUZE/BANDAGES/DRESSINGS)
SUCTION FRAZIER HANDLE 10FR (MISCELLANEOUS)
SUCTION TUBE FRAZIER 10FR DISP (MISCELLANEOUS) IMPLANT
SUT MERSILENE 4 0 P 3 (SUTURE) IMPLANT
SUT PROLENE 4 0 PS 2 18 (SUTURE) ×4 IMPLANT
SUT VIC AB 2-0 CT1 27 (SUTURE)
SUT VIC AB 2-0 CT1 TAPERPNT 27 (SUTURE) IMPLANT
SYR CONTROL 10ML LL (SYRINGE) IMPLANT
TOWEL GREEN STERILE (TOWEL DISPOSABLE) ×2 IMPLANT
TOWEL GREEN STERILE FF (TOWEL DISPOSABLE) ×2 IMPLANT
TUBE CONNECTING 12X1/4 (SUCTIONS) IMPLANT
UNDERPAD 30X36 HEAVY ABSORB (UNDERPADS AND DIAPERS) ×2 IMPLANT
WATER STERILE IRR 1000ML POUR (IV SOLUTION) ×2 IMPLANT

## 2021-06-29 NOTE — Anesthesia Postprocedure Evaluation (Signed)
Anesthesia Post Note  Patient: Jesse George  Procedure(s) Performed: Right index finger and palm subtotal palmar fasciectomy.  Right ring finger and palm subtotal palmar fasciectomy. (Right: Hand)     Patient location during evaluation: PACU Anesthesia Type: Regional Level of consciousness: awake and alert Pain management: pain level controlled Vital Signs Assessment: post-procedure vital signs reviewed and stable Respiratory status: spontaneous breathing, nonlabored ventilation and respiratory function stable Cardiovascular status: stable and blood pressure returned to baseline Anesthetic complications: no   No notable events documented.  Last Vitals:  Vitals:   06/29/21 1611 06/29/21 1626  BP: 114/86 (!) 127/91  Pulse: 73 76  Resp: 11 17  Temp:  36.5 C  SpO2: 97% 100%    Last Pain:  Vitals:   06/29/21 1626  TempSrc:   PainSc: 0-No pain                 Audry Pili

## 2021-06-29 NOTE — Transfer of Care (Signed)
Immediate Anesthesia Transfer of Care Note  Patient: Jesse George  Procedure(s) Performed: Right index finger and palm subtotal palmar fasciectomy.  Right ring finger and palm subtotal palmar fasciectomy. (Right: Hand)  Patient Location: PACU  Anesthesia Type:MAC combined with regional for post-op pain  Level of Consciousness: awake, alert  and oriented  Airway & Oxygen Therapy: Patient Spontanous Breathing  Post-op Assessment: Report given to RN and Post -op Vital signs reviewed and stable  Post vital signs: Reviewed  Last Vitals:  Vitals Value Taken Time  BP 159/93 06/29/21 1556  Temp    Pulse 82 06/29/21 1557  Resp 22 06/29/21 1557  SpO2 97 % 06/29/21 1557  Vitals shown include unvalidated device data.  Last Pain:  Vitals:   06/29/21 1329  TempSrc:   PainSc: 0-No pain         Complications: No notable events documented.

## 2021-06-29 NOTE — Anesthesia Preprocedure Evaluation (Addendum)
Anesthesia Evaluation  Patient identified by MRN, date of birth, ID band Patient awake    Reviewed: Allergy & Precautions, NPO status , Patient's Chart, lab work & pertinent test results  History of Anesthesia Complications Negative for: history of anesthetic complications  Airway Mallampati: III  TM Distance: <3 FB Neck ROM: Full    Dental  (+) Dental Advisory Given   Pulmonary neg pulmonary ROS,    Pulmonary exam normal        Cardiovascular negative cardio ROS Normal cardiovascular exam     Neuro/Psych negative neurological ROS  negative psych ROS   GI/Hepatic negative GI ROS, Neg liver ROS,   Endo/Other   Obesity   Renal/GU negative Renal ROS   H/o testicular cancer    Musculoskeletal negative musculoskeletal ROS (+)   Abdominal   Peds  Hematology negative hematology ROS (+)   Anesthesia Other Findings   Reproductive/Obstetrics                            Anesthesia Physical Anesthesia Plan  ASA: 2  Anesthesia Plan: Regional   Post-op Pain Management:    Induction:   PONV Risk Score and Plan: 1 and Ondansetron, Treatment may vary due to age or medical condition and Propofol infusion  Airway Management Planned: Natural Airway and Simple Face Mask  Additional Equipment: None  Intra-op Plan:   Post-operative Plan:   Informed Consent: I have reviewed the patients History and Physical, chart, labs and discussed the procedure including the risks, benefits and alternatives for the proposed anesthesia with the patient or authorized representative who has indicated his/her understanding and acceptance.       Plan Discussed with: CRNA and Anesthesiologist  Anesthesia Plan Comments:        Anesthesia Quick Evaluation

## 2021-06-29 NOTE — Discharge Instructions (Addendum)
We recommend that you to take vitamin C 1000 mg a day to promote healing. We also recommend that if you require  pain medicine that you take a stool softener to prevent constipation as most pain medicines will have constipation side effects. We recommend either Peri-Colace or Senokot and recommend that you also consider adding MiraLAX as well to prevent the constipation affects from pain medicine if you are required to use them. These medicines are over the counter and may be purchased at a local pharmacy. A cup of yogurt and a probiotic can also be helpful during the recovery process as the medicines can disrupt your intestinal environment. Keep bandage clean and dry.  Call for any problems.  No smoking.  Criteria for driving a car: you should be off your pain medicine for 7-8 hours, able to drive one handed(confident), thinking clearly and feeling able in your judgement to drive. Continue elevation as it will decrease swelling.  If instructed by MD move your fingers within the confines of the bandage/splint.  Use ice if instructed by your MD. Call immediately for any sudden loss of feeling in your hand/arm or change in functional abilities of the extremity.   We have phoned her medicines into her pharmacy.  If you have any problems call Dr. Amedeo Plenty on his cell phone at [phone]

## 2021-06-29 NOTE — Op Note (Signed)
Operative note June 29, 2021 Jesse Kaufman MD  Preoperative diagnosis #1 right hand and index finger Dupuytren's contracture #2 right hand and ring finger Dupuytren's contracture #3 dorsal mass right index finger dorsal PIP joint  Postop diagnosis the same  Procedure #1 subtotal palmar fasciectomy right index finger and palm secondary to Dupuytren's disease #2 neuroplasty radial and ulnar digital nerves right index finger and hand #3 subtotal palmar fasciectomy right ring finger secondary to Dupuytren's disease #4 neuroplasty radial and ulnar digital nerves and common digital nerve to the third and fourth webspace about the ring finger #5 less than 1 cm 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  Surgeon Jesse George  Specimen 1  Anesthesia block with IV sedation  Tourniquet time less than an hour  Complications none  Description of procedure: Patient was seen by myself and anesthesia.  He was counseled in the preop holding area and underwent smooth induction of IV sedation.  He was prepped with Hibiclens scrub followed by Betadine scrub and paint.  Once this was complete outlined marks were made and tourniquet was insufflated.  The operation commenced with a modified Bruner incision about the ring finger and palm skin flaps were elevated dissection was carried out the patient then underwent circumferential dissection of the Dupuytren's large mass centrally followed by identification and dissection of the natatory and pretendinous cord to the ring finger.  Neuroplasty of the radial and ulnar digital nerves was accomplished as well as the common digital nerve to the fourth and third webspace.  The patient tolerated this well this was removed/the diseased tissue was removed and the neurovascular bundles and flexor tendon apparatus was Intact.  This wound was ultimately irrigated hemostasis secured and closed with Prolene.  Following this patient underwent modified  Bruner incision about the index finger dissection was carried down neuroplasty of the radial digital nerve and ulnar digital nerve was accomplished and a dissection of the natatory and pretendinous cord was performed removing the Dupuytren's disease from the index finger and palm.  This was a large nodule with extensions distally.  This was removed in its entirety and neuroplasty of the radial digital nerve to the index finger and second webspace common digital nerve as well as ulnar digital nerve to the index finger was accomplished.  These wounds were ultimately irrigated and closed after hemostasis was secured at the end of the case.  Following this attention was turned dorsally transverse incision was made over the PIP joint of the index finger circumferential dissection of a fibroma was accomplished.  The patient previously had a cystic mass in this region however this was more of a fibrous tumor consistent with a Dupuytren's contracture thus this will have a high likelihood of recurrence in my opinion.  I discussed these issues at length preoperatively with the patient of course.  All neurovascular bundles were carefully protected.  Hemostasis was excellent and there were no issues or complications.  Will monitor his condition in the recovery room and discharge him home tonight.  I discussed all issues with his significant other.  Patient and I went over all this and is postoperatively and he has had prior procedures on the left hand and small finger of the right hand thus understands the postop protocol for Dupuytren's subtotal palmar fasciectomy.  It is a pleasure to participate in his care.  There were no complicating features or problems.  Jesse Corsino MD

## 2021-06-29 NOTE — Anesthesia Procedure Notes (Signed)
Anesthesia Regional Block: Supraclavicular block   Pre-Anesthetic Checklist: , timeout performed,  Correct Patient, Correct Site, Correct Laterality,  Correct Procedure, Correct Position, site marked,  Risks and benefits discussed,  Surgical consent,  Pre-op evaluation,  At surgeon's request and post-op pain management  Laterality: Right  Prep: chloraprep       Needles:  Injection technique: Single-shot  Needle Type: Echogenic Needle     Needle Length: 5cm  Needle Gauge: 21     Additional Needles:   Narrative:  Start time: 06/29/2021 2:19 PM End time: 06/29/2021 2:22 PM Injection made incrementally with aspirations every 5 mL.  Performed by: Personally  Anesthesiologist: Audry Pili, MD  Additional Notes: No pain on injection. No increased resistance to injection. Injection made in 5cc increments. Good needle visualization. Patient tolerated the procedure well.
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
## 2021-06-30 ENCOUNTER — Encounter (HOSPITAL_COMMUNITY): Payer: Self-pay | Admitting: Orthopedic Surgery

## 2021-07-01 LAB — SURGICAL PATHOLOGY

## 2021-11-11 ENCOUNTER — Ambulatory Visit (INDEPENDENT_AMBULATORY_CARE_PROVIDER_SITE_OTHER): Payer: 59 | Admitting: Urology

## 2021-11-11 ENCOUNTER — Encounter: Payer: Self-pay | Admitting: Urology

## 2021-11-11 ENCOUNTER — Other Ambulatory Visit: Payer: Self-pay

## 2021-11-11 VITALS — BP 124/80 | HR 76 | Wt 265.0 lb

## 2021-11-11 DIAGNOSIS — N503 Cyst of epididymis: Secondary | ICD-10-CM | POA: Diagnosis not present

## 2021-11-11 DIAGNOSIS — L723 Sebaceous cyst: Secondary | ICD-10-CM

## 2021-11-11 DIAGNOSIS — Z8547 Personal history of malignant neoplasm of testis: Secondary | ICD-10-CM | POA: Diagnosis not present

## 2021-11-11 LAB — URINALYSIS, ROUTINE W REFLEX MICROSCOPIC
Bilirubin, UA: NEGATIVE
Glucose, UA: NEGATIVE
Ketones, UA: NEGATIVE
Leukocytes,UA: NEGATIVE
Nitrite, UA: NEGATIVE
Protein,UA: NEGATIVE
Specific Gravity, UA: 1.025 (ref 1.005–1.030)
Urobilinogen, Ur: 0.2 mg/dL (ref 0.2–1.0)
pH, UA: 6 (ref 5.0–7.5)

## 2021-11-11 LAB — MICROSCOPIC EXAMINATION
Bacteria, UA: NONE SEEN
Epithelial Cells (non renal): NONE SEEN /hpf (ref 0–10)
RBC, Urine: NONE SEEN /hpf (ref 0–2)
Renal Epithel, UA: NONE SEEN /hpf
WBC, UA: NONE SEEN /hpf (ref 0–5)

## 2021-11-11 NOTE — Progress Notes (Signed)
Urological Symptom Review ° °Patient is experiencing the following symptoms: °Stream starts and stops °Weak stream °Erection problems (male only) ° ° °Review of Systems ° °Gastrointestinal (upper)  : °Negative for upper GI symptoms ° °Gastrointestinal (lower) : °Negative for lower GI symptoms ° °Constitutional : °Negative for symptoms ° °Skin: °Negative for skin symptoms ° °Eyes: °Negative for eye symptoms ° °Ear/Nose/Throat : °Negative for Ear/Nose/Throat symptoms ° °Hematologic/Lymphatic: °Negative for Hematologic/Lymphatic symptoms ° °Cardiovascular : °Negative for cardiovascular symptoms ° °Respiratory : °Negative for respiratory symptoms ° °Endocrine: °Negative for endocrine symptoms ° °Musculoskeletal: °Negative for musculoskeletal symptoms ° °Neurological: °Negative for neurological symptoms ° °Psychologic: °Negative for psychiatric symptoms ° °
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
## 2021-11-24 ENCOUNTER — Ambulatory Visit: Payer: 59 | Admitting: Urology

## 2021-11-24 ENCOUNTER — Ambulatory Visit (INDEPENDENT_AMBULATORY_CARE_PROVIDER_SITE_OTHER): Payer: 59 | Admitting: Urology

## 2021-11-24 ENCOUNTER — Encounter: Payer: Self-pay | Admitting: Urology

## 2021-11-24 ENCOUNTER — Other Ambulatory Visit: Payer: Self-pay

## 2021-11-24 VITALS — BP 127/82 | HR 76 | Wt 265.0 lb

## 2021-11-24 DIAGNOSIS — Z8547 Personal history of malignant neoplasm of testis: Secondary | ICD-10-CM

## 2021-11-24 DIAGNOSIS — L723 Sebaceous cyst: Secondary | ICD-10-CM
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
## 2021-11-24 NOTE — Progress Notes (Signed)
Urological Symptom Review ° °Patient is experiencing the following symptoms: °Stream starts and stops °Weak stream °Erection problems (male only) ° ° °Review of Systems ° °Gastrointestinal (upper)  : °Negative for upper GI symptoms ° °Gastrointestinal (lower) : °Negative for lower GI symptoms ° °Constitutional : °Negative for symptoms ° °Skin: °Negative for skin symptoms ° °Eyes: °Negative for eye symptoms ° °Ear/Nose/Throat : °Negative for Ear/Nose/Throat symptoms ° °Hematologic/Lymphatic: °Negative for Hematologic/Lymphatic symptoms ° °Cardiovascular : °Negative for cardiovascular symptoms ° °Respiratory : °Negative for respiratory symptoms ° °Endocrine: °Negative for endocrine symptoms ° °Musculoskeletal: °Negative for musculoskeletal symptoms ° °Neurological: °Negative for neurological symptoms ° °Psychologic: °Negative for psychiatric symptoms ° °

## 2021-12-02 ENCOUNTER — Telehealth: Payer: Self-pay

## 2021-12-02 NOTE — Telephone Encounter (Signed)
Patient called stating wound site has a raised area underneath the skin the size of a pencil head eraser.  He notes some swelling around the wound site. No redness, drainage, or pain. Patient states the area is a little tender.  Please advise.

## 2021-12-02 NOTE — Telephone Encounter (Signed)
Patient called and made aware.

## 2023-11-08 DIAGNOSIS — M720 Palmar fascial fibromatosis [Dupuytren]: Secondary | ICD-10-CM | POA: Diagnosis not present

## 2023-11-14 DIAGNOSIS — M720 Palmar fascial fibromatosis [Dupuytren]: Secondary | ICD-10-CM | POA: Diagnosis not present

## 2023-11-21 DIAGNOSIS — M25642 Stiffness of left hand, not elsewhere classified: Secondary | ICD-10-CM | POA: Diagnosis not present

## 2023-12-11 DIAGNOSIS — R229 Localized swelling, mass and lump, unspecified: Secondary | ICD-10-CM | POA: Diagnosis not present

## 2023-12-11 DIAGNOSIS — Z6833 Body mass index (BMI) 33.0-33.9, adult: Secondary | ICD-10-CM | POA: Diagnosis not present

## 2023-12-11 DIAGNOSIS — M25642 Stiffness of left hand, not elsewhere classified: Secondary | ICD-10-CM | POA: Diagnosis not present

## 2023-12-26 DIAGNOSIS — M25642 Stiffness of left hand, not elsewhere classified: Secondary | ICD-10-CM | POA: Diagnosis not present

## 2023-12-29 DIAGNOSIS — Z6832 Body mass index (BMI) 32.0-32.9, adult: Secondary | ICD-10-CM | POA: Diagnosis not present

## 2023-12-29 DIAGNOSIS — R229 Localized swelling, mass and lump, unspecified: Secondary | ICD-10-CM | POA: Diagnosis not present

## 2024-01-08 DIAGNOSIS — C44729 Squamous cell carcinoma of skin of left lower limb, including hip: Secondary | ICD-10-CM | POA: Diagnosis not present

## 2024-01-08 DIAGNOSIS — D485 Neoplasm of uncertain behavior of skin: Secondary | ICD-10-CM | POA: Diagnosis not present

## 2024-02-26 DIAGNOSIS — L570 Actinic keratosis: Secondary | ICD-10-CM | POA: Diagnosis not present

## 2024-02-26 DIAGNOSIS — L821 Other seborrheic keratosis: Secondary | ICD-10-CM | POA: Diagnosis not present

## 2024-02-26 DIAGNOSIS — L573 Poikiloderma of Civatte: Secondary | ICD-10-CM | POA: Diagnosis not present

## 2024-02-26 DIAGNOSIS — Z129 Encounter for screening for malignant neoplasm, site unspecified: Secondary | ICD-10-CM | POA: Diagnosis not present

## 2024-02-26 DIAGNOSIS — Z859 Personal history of malignant neoplasm, unspecified: Secondary | ICD-10-CM | POA: Diagnosis not present

## 2024-02-26 DIAGNOSIS — M720 Palmar fascial fibromatosis [Dupuytren]: Secondary | ICD-10-CM | POA: Diagnosis not present

## 2024-02-26 DIAGNOSIS — D1801 Hemangioma of skin and subcutaneous tissue: Secondary | ICD-10-CM | POA: Diagnosis not present

## 2024-02-26 DIAGNOSIS — D2371 Other benign neoplasm of skin of right lower limb, including hip: Secondary | ICD-10-CM | POA: Diagnosis not present

## 2024-02-26 DIAGNOSIS — L738 Other specified follicular disorders: Secondary | ICD-10-CM | POA: Diagnosis not present

## 2024-02-26 DIAGNOSIS — D2372 Other benign neoplasm of skin of left lower limb, including hip: Secondary | ICD-10-CM | POA: Diagnosis not present

## 2024-06-05 DIAGNOSIS — M720 Palmar fascial fibromatosis [Dupuytren]: Secondary | ICD-10-CM | POA: Diagnosis not present

## 2024-06-05 DIAGNOSIS — Z4789 Encounter for other orthopedic aftercare: Secondary | ICD-10-CM | POA: Diagnosis not present

## 2024-06-07 DIAGNOSIS — M720 Palmar fascial fibromatosis [Dupuytren]: Secondary | ICD-10-CM | POA: Diagnosis not present

## 2024-06-27 DIAGNOSIS — M79641 Pain in right hand: Secondary | ICD-10-CM | POA: Diagnosis not present

## 2024-07-02 DIAGNOSIS — M9904 Segmental and somatic dysfunction of sacral region: Secondary | ICD-10-CM | POA: Diagnosis not present

## 2024-07-02 DIAGNOSIS — M9903 Segmental and somatic dysfunction of lumbar region: Secondary | ICD-10-CM | POA: Diagnosis not present

## 2024-07-02 DIAGNOSIS — M545 Low back pain, unspecified: Secondary | ICD-10-CM | POA: Diagnosis not present

## 2024-07-02 DIAGNOSIS — M47817 Spondylosis without myelopathy or radiculopathy, lumbosacral region: Secondary | ICD-10-CM | POA: Diagnosis not present

## 2024-07-18 DIAGNOSIS — M9902 Segmental and somatic dysfunction of thoracic region: Secondary | ICD-10-CM | POA: Diagnosis not present

## 2024-07-18 DIAGNOSIS — M9903 Segmental and somatic dysfunction of lumbar region: Secondary | ICD-10-CM | POA: Diagnosis not present

## 2024-07-18 DIAGNOSIS — M9901 Segmental and somatic dysfunction of cervical region: Secondary | ICD-10-CM | POA: Diagnosis not present

## 2024-07-18 DIAGNOSIS — M47817 Spondylosis without myelopathy or radiculopathy, lumbosacral region: Secondary | ICD-10-CM | POA: Diagnosis not present

## 2024-07-18 DIAGNOSIS — M545 Low back pain, unspecified: Secondary | ICD-10-CM | POA: Diagnosis not present

## 2024-08-08 DIAGNOSIS — M9903 Segmental and somatic dysfunction of lumbar region: Secondary | ICD-10-CM | POA: Diagnosis not present

## 2024-08-08 DIAGNOSIS — M47817 Spondylosis without myelopathy or radiculopathy, lumbosacral region: Secondary | ICD-10-CM | POA: Diagnosis not present

## 2024-08-08 DIAGNOSIS — M9904 Segmental and somatic dysfunction of sacral region: Secondary | ICD-10-CM | POA: Diagnosis not present

## 2024-08-08 DIAGNOSIS — M545 Low back pain, unspecified: Secondary | ICD-10-CM | POA: Diagnosis not present

## 2024-09-05 DIAGNOSIS — M9903 Segmental and somatic dysfunction of lumbar region: Secondary | ICD-10-CM | POA: Diagnosis not present

## 2024-09-05 DIAGNOSIS — M9904 Segmental and somatic dysfunction of sacral region: Secondary | ICD-10-CM | POA: Diagnosis not present

## 2024-09-05 DIAGNOSIS — M545 Low back pain, unspecified: Secondary | ICD-10-CM | POA: Diagnosis not present

## 2024-09-05 DIAGNOSIS — M47817 Spondylosis without myelopathy or radiculopathy, lumbosacral region: Secondary | ICD-10-CM | POA: Diagnosis not present
